# Patient Record
Sex: Male | Born: 1975
Health system: Southern US, Community
[De-identification: ages and names within clinical notes are randomized; demographics above are authoritative.]

## PROBLEM LIST (undated history)

## (undated) DIAGNOSIS — I10 Essential (primary) hypertension: Secondary | ICD-10-CM

## (undated) HISTORY — PX: MANDIBLE SURGERY: SHX707

## (undated) HISTORY — DX: Essential (primary) hypertension: I10

## (undated) HISTORY — PX: FRACTURE SURGERY: SHX138

---

## 2002-05-31 ENCOUNTER — Emergency Department (HOSPITAL_COMMUNITY): Admission: EM | Admit: 2002-05-31 | Discharge: 2002-05-31 | Payer: Self-pay | Admitting: Emergency Medicine

## 2009-08-31 ENCOUNTER — Encounter: Admission: RE | Admit: 2009-08-31 | Discharge: 2009-08-31 | Payer: Self-pay | Admitting: Internal Medicine

## 2009-08-31 ENCOUNTER — Ambulatory Visit (HOSPITAL_COMMUNITY): Admission: EM | Admit: 2009-08-31 | Discharge: 2009-08-31 | Payer: Self-pay | Admitting: Emergency Medicine

## 2011-01-26 LAB — CBC
HCT: 45.1 % (ref 39.0–52.0)
Hemoglobin: 15.7 g/dL (ref 13.0–17.0)
MCHC: 34.7 g/dL (ref 30.0–36.0)
RDW: 15 % (ref 11.5–15.5)

## 2011-01-26 LAB — DIFFERENTIAL
Basophils Absolute: 0 10*3/uL (ref 0.0–0.1)
Basophils Relative: 0 % (ref 0–1)
Eosinophils Relative: 0 % (ref 0–5)
Monocytes Absolute: 0.6 10*3/uL (ref 0.1–1.0)

## 2011-01-26 LAB — BASIC METABOLIC PANEL
BUN: 13 mg/dL (ref 6–23)
Chloride: 103 mEq/L (ref 96–112)
Creatinine, Ser: 1.1 mg/dL (ref 0.4–1.5)
GFR calc non Af Amer: >60 mL/min (ref >60)
Glucose, Bld: 94 mg/dL (ref 70–99)

## 2011-11-06 ENCOUNTER — Ambulatory Visit (INDEPENDENT_AMBULATORY_CARE_PROVIDER_SITE_OTHER): Payer: PRIVATE HEALTH INSURANCE

## 2011-11-06 DIAGNOSIS — L299 Pruritus, unspecified: Secondary | ICD-10-CM

## 2011-11-06 DIAGNOSIS — Z0000 Encounter for general adult medical examination without abnormal findings: Secondary | ICD-10-CM

## 2011-11-06 DIAGNOSIS — Z719 Counseling, unspecified: Secondary | ICD-10-CM

## 2011-11-06 DIAGNOSIS — F17200 Nicotine dependence, unspecified, uncomplicated: Secondary | ICD-10-CM

## 2012-04-18 ENCOUNTER — Telehealth: Payer: Self-pay

## 2012-04-18 NOTE — Telephone Encounter (Signed)
PT STATES THAT HE WAS PRESCRIBED BLOOD PRESSURE MEDICATION DURING HIS LAST OFFICE VISIT WITH [place] BUT HE NEVER HAD THE RX FILLED SO PT WOULD LIKE TO KNOW IF HE COULD HAVE THIS MEDICATION CALLED INTO THE PHARMACY. PHARMACY:[facility] ON [address]

## 2012-04-18 NOTE — Telephone Encounter (Signed)
Patient last seen in January... pls pull chart and route to PA pile to advise.

## 2012-04-19 MED ORDER — AMLODIPINE BESYLATE 5 MG PO TABS: 5.0000 mg | ORAL_TABLET | Freq: Every day | ORAL | Status: DC

## 2012-04-19 NOTE — Telephone Encounter (Signed)
Chart pulled to PA

## 2012-04-19 NOTE — Telephone Encounter (Signed)
Can start Norvasc 5 mg daily. Please have him RTC in 1 week to recheck the BP on the medication.

## 2012-04-22 NOTE — Telephone Encounter (Signed)
LMOM advising patient info below.

## 2012-05-21 ENCOUNTER — Ambulatory Visit (INDEPENDENT_AMBULATORY_CARE_PROVIDER_SITE_OTHER): Payer: BC Managed Care – PPO | Admitting: Internal Medicine

## 2012-05-21 ENCOUNTER — Encounter: Payer: Self-pay | Admitting: Internal Medicine

## 2012-05-21 VITALS — BP 122/92 | HR 76 | Temp 98.0°F | Resp 16 | Ht 65.0 in | Wt 230.0 lb

## 2012-05-21 DIAGNOSIS — F17200 Nicotine dependence, unspecified, uncomplicated: Secondary | ICD-10-CM

## 2012-05-21 DIAGNOSIS — I10 Essential (primary) hypertension: Secondary | ICD-10-CM | POA: Insufficient documentation

## 2012-05-21 DIAGNOSIS — Z79899 Other long term (current) drug therapy: Secondary | ICD-10-CM

## 2012-05-21 LAB — COMPREHENSIVE METABOLIC PANEL
Alkaline Phosphatase: 85 U/L (ref 39–117)
Creat: 1.1 mg/dL (ref 0.50–1.35)
Glucose, Bld: 90 mg/dL (ref 70–99)
Sodium: 140 mEq/L (ref 135–145)
Total Bilirubin: 0.6 mg/dL (ref 0.3–1.2)
Total Protein: 6.9 g/dL (ref 6.0–8.3)

## 2012-05-21 MED ORDER — AMLODIPINE BESYLATE 5 MG PO TABS: 5.0000 mg | ORAL_TABLET | Freq: Every day | ORAL | Status: DC

## 2012-05-21 NOTE — Progress Notes (Signed)
  Subjective:    Patient ID: Paul [name], male    DOB: 1976/08/31, 36 y.o.   MRN: [id number]  HPI HTN controlled Still smoking, agrees to quit Tolerates meds   Review of Systems     Objective:   Physical Exam Normal       Assessment & Plan:  RF meds 36yr

## 2012-05-24 ENCOUNTER — Encounter: Payer: Self-pay | Admitting: Radiology

## 2012-11-19 ENCOUNTER — Ambulatory Visit: Payer: BC Managed Care – PPO | Admitting: Internal Medicine

## 2013-02-15 ENCOUNTER — Ambulatory Visit (INDEPENDENT_AMBULATORY_CARE_PROVIDER_SITE_OTHER): Payer: PRIVATE HEALTH INSURANCE | Admitting: Internal Medicine

## 2013-02-15 ENCOUNTER — Ambulatory Visit: Payer: PRIVATE HEALTH INSURANCE

## 2013-02-15 VITALS — BP 130/92 | HR 77 | Temp 97.9°F | Resp 16 | Ht 65.0 in | Wt 240.0 lb

## 2013-02-15 DIAGNOSIS — Z0000 Encounter for general adult medical examination without abnormal findings: Secondary | ICD-10-CM

## 2013-02-15 DIAGNOSIS — I10 Essential (primary) hypertension: Secondary | ICD-10-CM

## 2013-02-15 DIAGNOSIS — E669 Obesity, unspecified: Secondary | ICD-10-CM

## 2013-02-15 DIAGNOSIS — Z79899 Other long term (current) drug therapy: Secondary | ICD-10-CM

## 2013-02-15 LAB — POCT CBC
Hemoglobin: 14.9 g/dL (ref 14.1–18.1)
Lymph, poc: 1.8 (ref 0.6–3.4)
MCH, POC: 28.5 pg (ref 27–31.2)
MCHC: 31.8 g/dL (ref 31.8–35.4)
MID (cbc): 0.6 (ref 0–0.9)
MPV: 9 fL (ref 0–99.8)
POC Granulocyte: 5.2 (ref 2–6.9)
POC LYMPH PERCENT: 23.1 %L (ref 10–50)
POC MID %: 8.2 %M (ref 0–12)
Platelet Count, POC: 285 10*3/uL (ref 142–424)
RDW, POC: 15 %

## 2013-02-15 LAB — POCT UA - MICROSCOPIC ONLY: Yeast, UA: NEGATIVE

## 2013-02-15 LAB — POCT URINALYSIS DIPSTICK
Bilirubin, UA: NEGATIVE
Ketones, UA: NEGATIVE
Leukocytes, UA: NEGATIVE
Nitrite, UA: NEGATIVE
Protein, UA: NEGATIVE
pH, UA: 6

## 2013-02-15 LAB — COMPREHENSIVE METABOLIC PANEL
ALT: 51 U/L (ref 0–53)
AST: 32 U/L (ref 0–37)
Alkaline Phosphatase: 95 U/L (ref 39–117)
Creat: 1.19 mg/dL (ref 0.50–1.35)
Sodium: 137 mEq/L (ref 135–145)
Total Bilirubin: 0.7 mg/dL (ref 0.3–1.2)
Total Protein: 7.3 g/dL (ref 6.0–8.3)

## 2013-02-15 LAB — LIPID PANEL
LDL Cholesterol: 109 mg/dL — ABNORMAL HIGH (ref 0–99)
Total CHOL/HDL Ratio: 4.6 Ratio
VLDL: 23 mg/dL (ref 0–40)

## 2013-02-15 LAB — POCT GLYCOSYLATED HEMOGLOBIN (HGB A1C): Hemoglobin A1C: 5.8

## 2013-02-15 MED ORDER — AMLODIPINE BESYLATE 5 MG PO TABS: 5.0000 mg | ORAL_TABLET | Freq: Every day | ORAL | Status: DC

## 2013-02-15 NOTE — Progress Notes (Signed)
Subjective:    Patient ID: Paul [name], male    DOB: [birth year], 37 y.o.   MRN: [id number]  HPI Doing well, working with nutritionist and losing weight. HTN controlled, feels good. Recently got married.   Review of Systems  Constitutional: Negative.   HENT: Negative.   Eyes: Negative.   Respiratory: Negative.   Cardiovascular: Negative.   Gastrointestinal: Negative.   Endocrine: Negative.   Genitourinary: Negative.   Musculoskeletal: Negative.   Skin: Negative.   Allergic/Immunologic: Negative.   Neurological: Negative.   Hematological: Negative.   Psychiatric/Behavioral: Negative.        Objective:   Physical Exam  Vitals reviewed. Constitutional: He is oriented to person, place, and time. He appears well-developed and well-nourished.  HENT:  Right Ear: External ear normal.  Left Ear: External ear normal.  Nose: Nose normal.  Mouth/Throat: Oropharynx is clear and moist.  Eyes: Conjunctivae and EOM are normal. Pupils are equal, [name], and reactive to light. No scleral icterus.  Neck: Normal range of motion. Neck supple. No tracheal deviation present. No thyromegaly present.  Cardiovascular: Normal rate, regular rhythm, normal heart sounds and intact distal pulses.   Pulmonary/Chest: Effort normal and breath sounds normal.  Abdominal: Bowel sounds are normal.  Genitourinary: Penis normal.  Musculoskeletal: Normal range of motion. He exhibits no edema and no tenderness.  Lymphadenopathy:    He has no cervical adenopathy.  Neurological: He is alert and oriented to person, place, and time. No cranial nerve deficit. He exhibits normal muscle tone. Coordination normal.  Skin: Skin is warm and dry.  Psychiatric: He has a normal mood and affect. His behavior is normal. Judgment and thought content normal.    Results for orders placed in visit on 02/15/13  POCT CBC      Result Value Range   WBC 7.6  4.6 - 10.2 K/uL   Lymph, poc 1.8  0.6 - 3.4   POC LYMPH PERCENT  23.1  10 - 50 %L   MID (cbc) 0.6  0 - 0.9   POC MID % 8.2  0 - 12 %M   POC Granulocyte 5.2  2 - 6.9   Granulocyte percent 68.7  37 - 80 %G   RBC 5.22  4.69 - 6.13 M/uL   Hemoglobin 14.9  14.1 - 18.1 g/dL   HCT, POC 40.9  81.1 - 53.7 %   MCV 89.7  80 - 97 fL   MCH, POC 28.5  27 - 31.2 pg   MCHC 31.8  31.8 - 35.4 g/dL   RDW, POC 91.4     Platelet Count, POC 285  142 - 424 K/uL   MPV 9.0  0 - 99.8 fL  POCT UA - MICROSCOPIC ONLY      Result Value Range   WBC, Ur, HPF, POC 0-1     RBC, urine, microscopic 2-5     Bacteria, U Microscopic trace     Mucus, UA neg     Epithelial cells, urine per micros 0-1     Crystals, Ur, HPF, POC neg     Casts, Ur, LPF, POC neg     Yeast, UA neg    POCT URINALYSIS DIPSTICK      Result Value Range   Color, UA yellow     Clarity, UA clear     Glucose, UA neg     Bilirubin, UA neg     Ketones, UA neg     Spec Grav, UA 1.025  Blood, UA small     pH, UA 6.0     Protein, UA neg     Urobilinogen, UA 0.2     Nitrite, UA neg     Leukocytes, UA Negative    GLUCOSE, POCT (MANUAL RESULT ENTRY)      Result Value Range   POC Glucose 110 (*) 70 - 99 mg/dl  POCT GLYCOSYLATED HEMOGLOBIN (HGB A1C)      Result Value Range   Hemoglobin A1C 5.8     [facility] reading (PRIMARY) by  Dr.Guest smoker, normal cxr        Assessment & Plan:  HTN controlled Obesity improving RF meds 1 yr

## 2013-02-15 NOTE — Patient Instructions (Signed)
Hypertension As your heart beats, it forces blood through your arteries. This force is your blood pressure. If the pressure is too high, it is called hypertension (HTN) or high blood pressure. HTN is dangerous because you may have it and not know it. High blood pressure may mean that your heart has to work harder to pump blood. Your arteries may be narrow or stiff. The extra work puts you at risk for heart disease, stroke, and other problems.  Blood pressure consists of two numbers, a higher number over a lower, 110/72, for example. It is stated as "110 over 72." The ideal is below 120 for the top number (systolic) and under 80 for the bottom (diastolic). Write down your blood pressure today. You should pay close attention to your blood pressure if you have certain conditions such as:  Heart failure.  Prior heart attack.  Diabetes  Chronic kidney disease.  Prior stroke.  Multiple risk factors for heart disease. To see if you have HTN, your blood pressure should be measured while you are seated with your arm held at the level of the heart. It should be measured at least twice. A one-time elevated blood pressure reading (especially in the Emergency Department) does not mean that you need treatment. There may be conditions in which the blood pressure is different between your right and left arms. It is important to see your caregiver soon for a recheck. Most people have essential hypertension which means that there is not a specific cause. This type of high blood pressure may be lowered by changing lifestyle factors such as:  Stress.  Smoking.  Lack of exercise.  Excessive weight.  Drug/tobacco/alcohol use.  Eating less salt. Most people do not have symptoms from high blood pressure until it has caused damage to the body. Effective treatment can often prevent, delay or reduce that damage. TREATMENT  When a cause has been identified, treatment for high blood pressure is directed at the  cause. There are a large number of medications to treat HTN. These fall into several categories, and your caregiver will help you select the medicines that are best for you. Medications may have side effects. You should review side effects with your caregiver. If your blood pressure stays high after you have made lifestyle changes or started on medicines,   Your medication(s) may need to be changed.  Other problems may need to be addressed.  Be certain you understand your prescriptions, and know how and when to take your medicine.  Be sure to follow up with your caregiver within the time frame advised (usually within two weeks) to have your blood pressure rechecked and to review your medications.  If you are taking more than one medicine to lower your blood pressure, make sure you know how and at what times they should be taken. Taking two medicines at the same time can result in blood pressure that is too low. SEEK IMMEDIATE MEDICAL CARE IF:  You develop a severe headache, blurred or changing vision, or confusion.  You have unusual weakness or numbness, or a faint feeling.  You have severe chest or abdominal pain, vomiting, or breathing problems. MAKE SURE YOU:   Understand these instructions.  Will watch your condition.  Will get help right away if you are not doing well or get worse. Document Released: 10/10/2005 Document Revised: 01/02/2012 Document Reviewed: 05/30/2008 [facility] Patient Information 2013 [place], [place]. Nicotine Addiction Nicotine can act as both a stimulant (excites/activates) and a sedative (calms/quiets). Immediately after  exposure to nicotine, there is a "kick" caused in part by the drug's stimulation of the adrenal glands and resulting discharge of adrenaline (epinephrine). The rush of adrenaline stimulates the body and causes a sudden release of sugar. This means that smokers are always slightly hyperglycemic. Hyperglycemic means that the blood sugar is high,  just like in diabetics. Nicotine also decreases the amount of insulin which helps control sugar levels in the body. There is an increase in blood pressure, breathing, and the rate of heart beats.  In addition, nicotine indirectly causes a release of dopamine in the brain that controls pleasure and motivation. A similar reaction is seen with other drugs of abuse, such as cocaine and heroin. This dopamine release is thought to cause the pleasurable sensations when smoking. In some different cases, nicotine can also create a calming effect, depending on sensitivity of the smoker's nervous system and the dose of nicotine taken. WHAT HAPPENS WHEN NICOTINE IS TAKEN FOR LONG PERIODS OF TIME?  Long-term use of nicotine results in addiction. It is difficult to stop.  Repeated use of nicotine creates tolerance. Higher doses of nicotine are needed to get the "kick." When nicotine use is stopped, withdrawal may last a month or more. Withdrawal may begin within a few hours after the last cigarette. Symptoms peak within the first few days and may lessen within a few weeks. For some people, however, symptoms may last for months or longer. Withdrawal symptoms include:   Irritability.  Craving.  Learning and attention deficits.  Sleep disturbances.  Increased appetite. Craving for tobacco may last for 6 months or longer. Many behaviors done while using nicotine can also play a part in the severity of withdrawal symptoms. For some people, the feel, smell, and sight of a cigarette and the ritual of obtaining, handling, lighting, and smoking the cigarette are closely linked with the pleasure of smoking. When stopped, they also miss the related behaviors which make the withdrawal or craving worse. While nicotine gum and patches may lessen the drug aspects of withdrawal, cravings often persist. WHAT ARE THE MEDICAL CONSEQUENCES OF NICOTINE USE?  Nicotine addiction accounts for one-third of all cancers. The top cancer  caused by tobacco is lung cancer. Lung cancer is the number one cancer killer of both men and women.  Smoking is also associated with cancers of the:  Mouth.  Pharynx.  Larynx.  Esophagus.  Stomach.  Pancreas.  Cervix.  Kidney.  Ureter.  Bladder.  Smoking also causes lung diseases such as lasting (chronic) bronchitis and emphysema.  It worsens asthma in adults and children.  Smoking increases the risk of heart disease, including:  Stroke.  Heart attack.  Vascular disease.  Aneurysm.  Passive or secondary smoke can also increase medical risks including:  Asthma in children.  Sudden Infant Death Syndrome (SIDS).  Additionally, dropped cigarettes are the leading cause of residential fire fatalities.  Nicotine poisoning has been reported from accidental ingestion of tobacco products by children and pets. Death usually results in a few minutes from respiratory failure (when a person stops breathing) caused by paralysis. TREATMENT   Medication. Nicotine replacement medicines such as nicotine gum and the patch are used to stop smoking. These medicines gradually lower the dosage of nicotine in the body. These medicines do not contain the carbon monoxide and other toxins found in tobacco smoke.  Hypnotherapy.  Relaxation therapy.  Nicotine Anonymous (a 12-step support program). Find times and locations in your local yellow pages. Document Released: 06/15/2004 Document Revised:  01/02/2012 Document Reviewed: 11/07/2007 [employer] Patient Information 2013 [place], [place].

## 2013-02-26 LAB — IFOBT (OCCULT BLOOD): IFOBT: NEGATIVE

## 2013-02-27 ENCOUNTER — Encounter: Payer: Self-pay | Admitting: Radiology

## 2013-12-27 ENCOUNTER — Ambulatory Visit (INDEPENDENT_AMBULATORY_CARE_PROVIDER_SITE_OTHER): Payer: 59 | Admitting: Family Medicine

## 2013-12-27 VITALS — BP 128/82 | HR 81 | Temp 98.5°F | Resp 16 | Ht 65.0 in | Wt 232.0 lb

## 2013-12-27 DIAGNOSIS — Z79899 Other long term (current) drug therapy: Secondary | ICD-10-CM

## 2013-12-27 DIAGNOSIS — Z0000 Encounter for general adult medical examination without abnormal findings: Secondary | ICD-10-CM

## 2013-12-27 DIAGNOSIS — E669 Obesity, unspecified: Secondary | ICD-10-CM

## 2013-12-27 DIAGNOSIS — I10 Essential (primary) hypertension: Secondary | ICD-10-CM

## 2013-12-27 LAB — COMPREHENSIVE METABOLIC PANEL
ALT: 54 U/L — ABNORMAL HIGH (ref 0–53)
AST: 35 U/L (ref 0–37)
Albumin: 4.4 g/dL (ref 3.5–5.2)
Alkaline Phosphatase: 84 U/L (ref 39–117)
BILIRUBIN TOTAL: 0.6 mg/dL (ref 0.2–1.2)
BUN: 18 mg/dL (ref 6–23)
CO2: 25 meq/L (ref 19–32)
CREATININE: 1.18 mg/dL (ref 0.50–1.35)
Calcium: 9.2 mg/dL (ref 8.4–10.5)
Chloride: 103 mEq/L (ref 96–112)
GLUCOSE: 85 mg/dL (ref 70–99)
Potassium: 4 mEq/L (ref 3.5–5.3)
Sodium: 136 mEq/L (ref 135–145)
Total Protein: 7.1 g/dL (ref 6.0–8.3)

## 2013-12-27 LAB — POCT CBC
Granulocyte percent: 66.3 %G (ref 37–80)
HEMATOCRIT: 44.7 % (ref 43.5–53.7)
Hemoglobin: 14.2 g/dL (ref 14.1–18.1)
LYMPH, POC: 2.6 (ref 0.6–3.4)
MCH: 28.8 pg (ref 27–31.2)
MCHC: 31.8 g/dL (ref 31.8–35.4)
MCV: 90.7 fL (ref 80–97)
MID (cbc): 0.6 (ref 0–0.9)
MPV: 9 fL (ref 0–99.8)
POC GRANULOCYTE: 6.2 (ref 2–6.9)
POC LYMPH %: 27.4 % (ref 10–50)
POC MID %: 6.3 %M (ref 0–12)
Platelet Count, POC: 290 10*3/uL (ref 142–424)
RBC: 4.93 M/uL (ref 4.69–6.13)
RDW, POC: 15.3 %
WBC: 9.4 10*3/uL (ref 4.6–10.2)

## 2013-12-27 LAB — LIPID PANEL
CHOL/HDL RATIO: 5 ratio
CHOLESTEROL: 166 mg/dL (ref 0–200)
HDL: 33 mg/dL — ABNORMAL LOW (ref >39)
LDL Cholesterol: 114 mg/dL — ABNORMAL HIGH (ref 0–99)
TRIGLYCERIDES: 93 mg/dL (ref <150)
VLDL: 19 mg/dL (ref 0–40)

## 2013-12-27 LAB — POCT GLYCOSYLATED HEMOGLOBIN (HGB A1C): HEMOGLOBIN A1C: 5.8

## 2013-12-27 MED ORDER — AMLODIPINE BESYLATE 5 MG PO TABS: 5.0000 mg | ORAL_TABLET | Freq: Every day | ORAL | Status: DC

## 2013-12-27 NOTE — Patient Instructions (Signed)
Continue same medication  I will send you a copy of the labs if they are normal, otherwise will call you.  Return in one year or as needed

## 2013-12-27 NOTE — Progress Notes (Signed)
Physical examination:  History: Patient is here for his regular physical examination. No major acute complaints. He needs a refill on his blood pressure medications.  Past medical history: Medications: Antihypertensive Allergies: None Past medical history: Hypertension Past surgical history: Fracture drawl, right thumb surgery  Family history: Mother and father both are deceased from lung cancer. Sister is living and well.  Social history: Tobacco use: Approximately one to 2 cigarettes a day, with one pack lasting him at least a week. Alcohol: Episodic Drugs: None Married, works [employer].  Review of systems: Constitutional: Unremarkable HEENT: Wears glasses. No other complaints. Needs new lenses Respiratory: Unremarkable Cardiovascular: Unremarkable Gastrointestinal: Unremarkable Genitourinary: Unremarkable Endocrine: Unremarkable Musculoskeletal: Multiple joint pains Dermatologic: Unremarkable Allergies: Unremarkable Neurologic: Unremarkable Hematologic: Unremarkable Psychiatric: Unremarkable   Physical examination. Overweight male in no major distress. TMs normal. Eyes PERRLA. Fundi benign. Throat clear. Neck supple without nodes or thyromegaly. Has a skin tag on his right shoulder. Chest is clear to auscultation. Heart regular without murmurs gallops or arrhythmias. Abdomen soft without organomegaly mass or tenderness. Normal male external genitalia testes descended. No hernias. Extremities unremarkable. Skin unremarkable.  Assessment: Normal physical examination Hypertension Obesity  Plan: Labs were ordered. Will need to complete his form.    Results for orders placed in visit on 12/27/13  POCT CBC      Result Value Ref Range   WBC 9.4  4.6 - 10.2 K/uL   Lymph, poc 2.6  0.6 - 3.4   POC LYMPH PERCENT 27.4  10 - 50 %L   MID (cbc) 0.6  0 - 0.9   POC MID % 6.3  0 - 12 %M   POC Granulocyte 6.2  2 - 6.9   Granulocyte percent 66.3  37 - 80 %G   RBC 4.93  4.69 - 6.13 M/uL   Hemoglobin 14.2  14.1 - 18.1 g/dL   HCT, POC 16.144.7  09.643.5 - 53.7 %   MCV 90.7  80 - 97 fL   MCH, POC 28.8  27 - 31.2 pg   MCHC 31.8  31.8 - 35.4 g/dL   RDW, POC 04.515.3     Platelet Count, POC 290  142 - 424 K/uL   MPV 9.0  0 - 99.8 fL  POCT GLYCOSYLATED HEMOGLOBIN (HGB A1C)      Result Value Ref Range   Hemoglobin A1C 5.8

## 2013-12-28 ENCOUNTER — Encounter: Payer: Self-pay | Admitting: *Deleted

## 2014-01-10 ENCOUNTER — Other Ambulatory Visit: Payer: Self-pay | Admitting: Internal Medicine

## 2014-04-23 ENCOUNTER — Other Ambulatory Visit: Payer: Self-pay | Admitting: Physician Assistant

## 2014-08-09 ENCOUNTER — Other Ambulatory Visit: Payer: Self-pay | Admitting: Family Medicine

## 2014-12-30 ENCOUNTER — Encounter: Payer: 59 | Admitting: Family Medicine

## 2015-01-05 ENCOUNTER — Ambulatory Visit (INDEPENDENT_AMBULATORY_CARE_PROVIDER_SITE_OTHER): Payer: 59 | Admitting: Family Medicine

## 2015-01-05 VITALS — BP 128/88 | HR 76 | Temp 98.7°F | Resp 16 | Ht 66.0 in | Wt 235.4 lb

## 2015-01-05 DIAGNOSIS — M7711 Lateral epicondylitis, right elbow: Secondary | ICD-10-CM

## 2015-01-05 DIAGNOSIS — F17200 Nicotine dependence, unspecified, uncomplicated: Secondary | ICD-10-CM

## 2015-01-05 DIAGNOSIS — Z720 Tobacco use: Secondary | ICD-10-CM

## 2015-01-05 DIAGNOSIS — Z0000 Encounter for general adult medical examination without abnormal findings: Secondary | ICD-10-CM

## 2015-01-05 DIAGNOSIS — E669 Obesity, unspecified: Secondary | ICD-10-CM

## 2015-01-05 LAB — POCT GLYCOSYLATED HEMOGLOBIN (HGB A1C): Hemoglobin A1C: 5.7

## 2015-01-05 NOTE — Patient Instructions (Signed)
Whenever your elbow flares up start icing it for about 15 minutes several times a day. You can also take over-the-counter Aleve 2 pills twice daily for a couple of weeks. If it does not come on down at those times you can return and we will try some other things for it. Also suggest trying to use an elbow band that you can buy at the pharmacy.  Work hard on losing weight. You need to try and get regular exercise such as a daily 30 minute for 45 minute walk or going to a gym. Also decrease her portion sizes meals, cut out liquid calories and most snacks, and try and fill up on more fruits and vegetables.  Pick a date such as Easter and quit the cigarettes. If you cannot do so return to get a prescription of a medicine such as Wellbutrin (bupropion)  We will fax in the results of your labs and they become available

## 2015-01-05 NOTE — Progress Notes (Signed)
Expand All Collapse All   Physical examination:  History: Patient is here for his regular physical examination. No major acute complaints. He needs a refill on his blood pressure medications.  Past medical history: Medications: Antihypertensive Allergies: None Past medical history: Hypertension Past surgical history: Fracture jaw, right thumb surgery  Family history: Mother and father both are deceased from lung cancer. Sister is living and well.  Social history: Tobacco use: Approximately one to 2 cigarettes a day, with one pack lasting him at least a week. 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  Review of systems: Constitutional: Unremarkable HEENT: Wears glasses. No other complaints. Needs new lenses Respiratory: Unremarkable Cardiovascular: Unremarkable Gastrointestinal: Unremarkable Genitourinary: Unremarkable Endocrine: Unremarkable Musculoskeletal: Multiple joint pains. He has right elbow pains off and on, especially related to doing a lot of lifting and such at work Dermatologic: Unremarkable Allergies: Unremarkable Neurologic: Unremarkable Hematologic: Unremarkable Psychiatric: Unremarkable   Physical examination. Overweight male in no major distress. TMs normal. Eyes PERRLA. Fundi benign. Throat clear. Neck supple without nodes or thyromegaly. Has a skin tag on his right shoulder. Chest is clear to auscultation. Heart regular without murmurs gallops or arrhythmias. Abdomen soft without organomegaly mass or tenderness. Normal male external genitalia testes descended. No hernias. Extremities unremarkable. Skin unremarkable  Assessment: Annual physical examination Overweight Tobacco use disorder, psychological dependence Intermittent right elbow pains, not inflamed currently  Plan: Patient needs lab testing done per his company physical requirements. Last year the HDL was low, and he is encouraged to try and get more  regular exercise. It is very important for his long-term health for him to lose weight and get off those last couple of cigarettes he smokes. We talked about medication options, and he is just going to try and commit to a date like Easter and quitting cigarettes and never touch another one.  If he fails in quitting his cigarettes we will try him with a course of Wellbutrin  Recommended a tennis elbow band to wear on his elbow when it starts flaring.

## 2015-01-06 LAB — COMPREHENSIVE METABOLIC PANEL
ALBUMIN: 4.3 g/dL (ref 3.5–5.2)
ALT: 41 U/L (ref 0–53)
AST: 45 U/L — ABNORMAL HIGH (ref 0–37)
Alkaline Phosphatase: 105 U/L (ref 39–117)
BUN: 12 mg/dL (ref 6–23)
CALCIUM: 9.5 mg/dL (ref 8.4–10.5)
CHLORIDE: 102 meq/L (ref 96–112)
CO2: 23 meq/L (ref 19–32)
Creat: 1.19 mg/dL (ref 0.50–1.35)
GLUCOSE: 90 mg/dL (ref 70–99)
POTASSIUM: 3.8 meq/L (ref 3.5–5.3)
SODIUM: 137 meq/L (ref 135–145)
TOTAL PROTEIN: 7.7 g/dL (ref 6.0–8.3)
Total Bilirubin: 1.9 mg/dL — ABNORMAL HIGH (ref 0.2–1.2)

## 2015-01-06 LAB — LIPID PANEL
CHOLESTEROL: 205 mg/dL — AB (ref 0–200)
HDL: 46 mg/dL (ref >=40)
LDL CALC: 124 mg/dL — AB (ref 0–99)
Total CHOL/HDL Ratio: 4.5 Ratio
Triglycerides: 175 mg/dL — ABNORMAL HIGH (ref <150)
VLDL: 35 mg/dL (ref 0–40)

## 2015-01-09 ENCOUNTER — Encounter: Payer: Self-pay | Admitting: Family Medicine

## 2015-02-04 IMAGING — CR DG CHEST 2V
2 series · 2 of 2 positions shown · non-contrast
Comparison: None

CLINICAL DATA: Complete physical exam.  Hypertension.

CHEST - 2 VIEW

[PA]
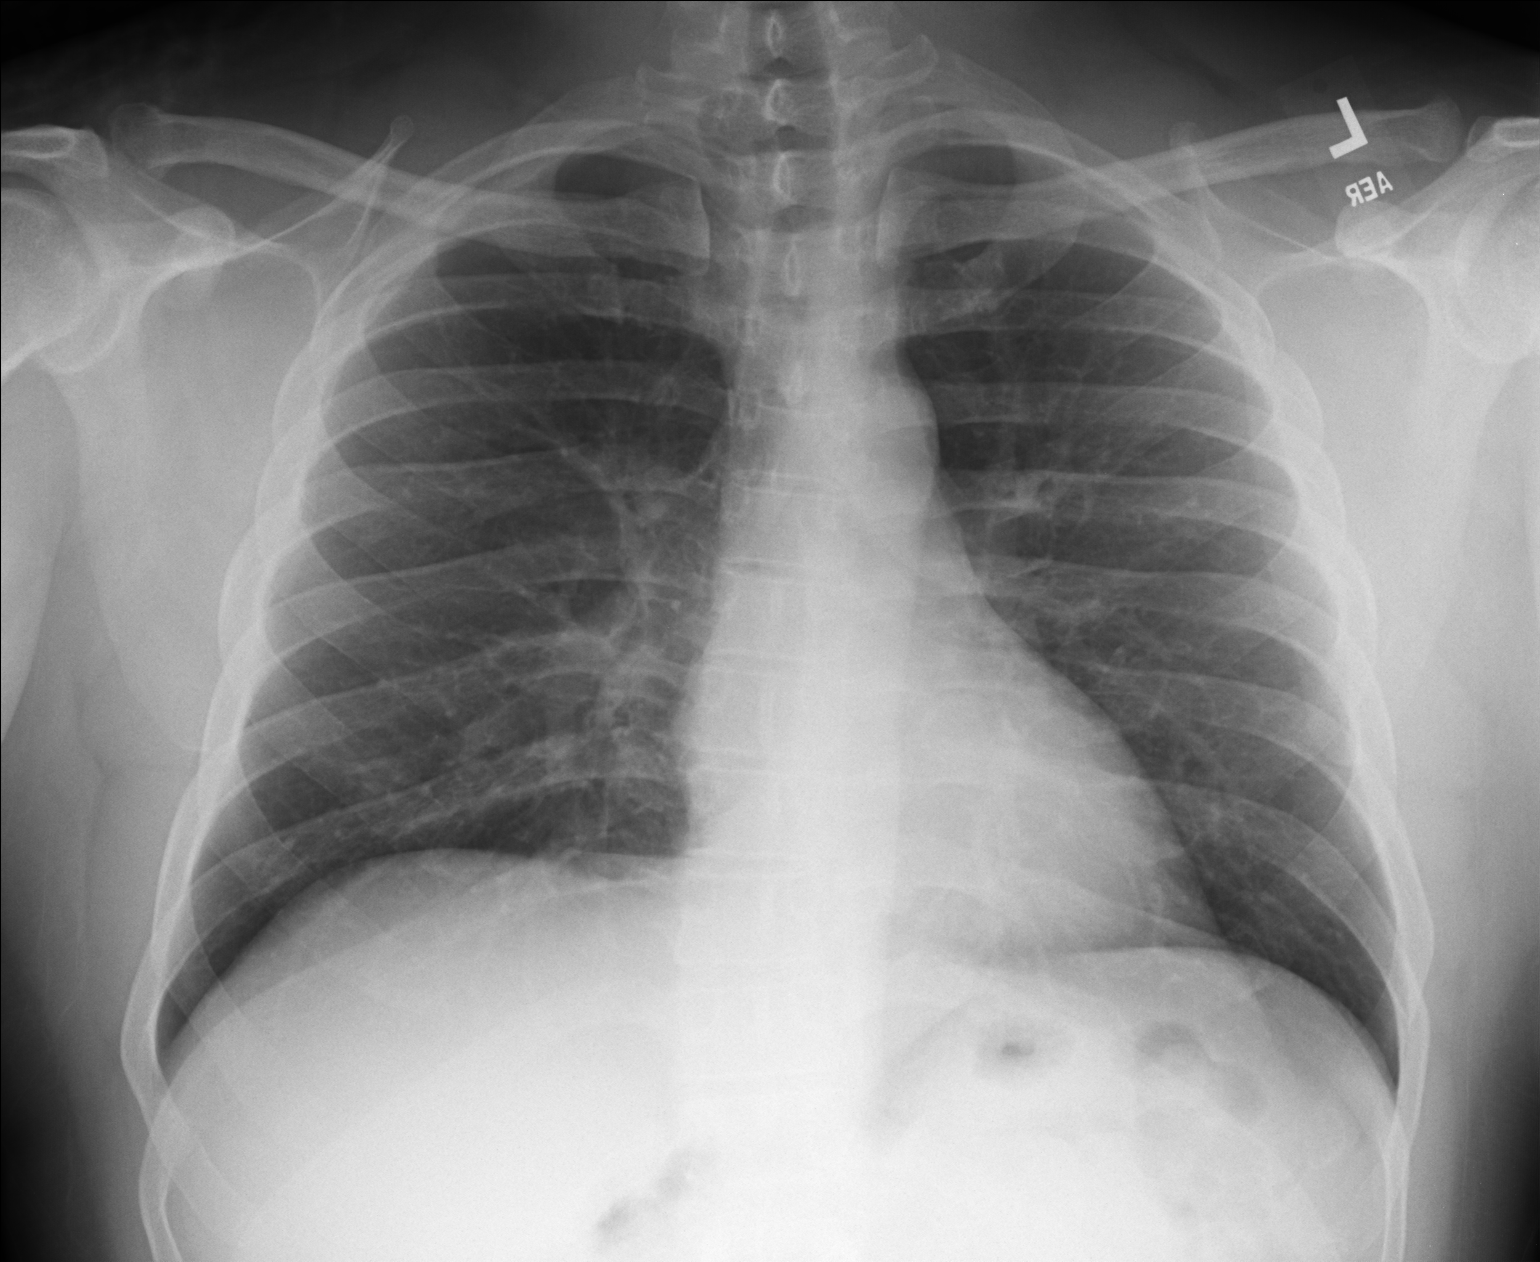

[lateral]
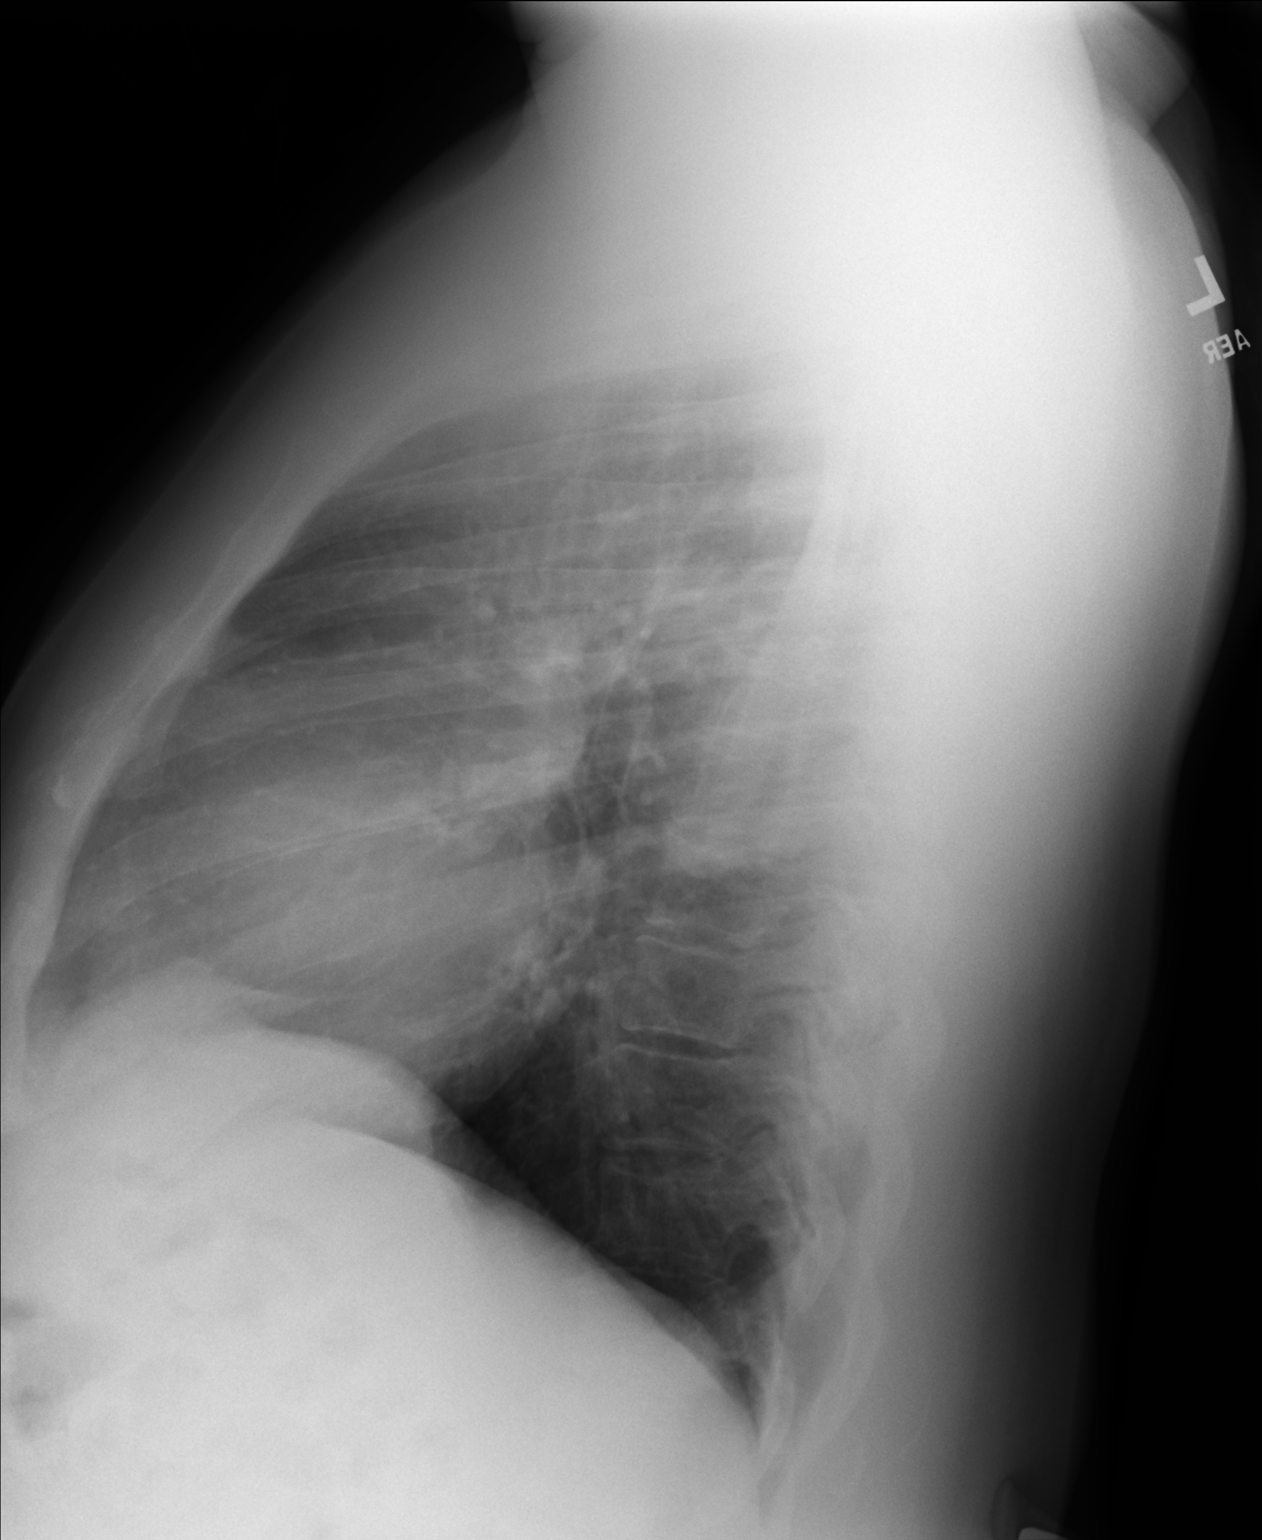

[2 of 2 positions shown; findings below may reference images not displayed]

FINDINGS: Heart and mediastinal contours are within normal limits.
No focal opacities or effusions.  No acute bony abnormality.
IMPRESSION: No active cardiopulmonary disease.

## 2015-03-31 ENCOUNTER — Other Ambulatory Visit: Payer: Self-pay | Admitting: Family Medicine

## 2015-07-30 ENCOUNTER — Other Ambulatory Visit: Payer: Self-pay | Admitting: Family Medicine

## 2015-09-11 ENCOUNTER — Ambulatory Visit: Payer: 59 | Admitting: Family Medicine

## 2015-09-14 ENCOUNTER — Ambulatory Visit (INDEPENDENT_AMBULATORY_CARE_PROVIDER_SITE_OTHER): Payer: 59 | Admitting: Family Medicine

## 2015-09-14 ENCOUNTER — Ambulatory Visit (INDEPENDENT_AMBULATORY_CARE_PROVIDER_SITE_OTHER): Payer: 59

## 2015-09-14 VITALS — BP 130/86 | HR 74 | Temp 98.7°F | Resp 18 | Ht 68.0 in | Wt 235.8 lb

## 2015-09-14 DIAGNOSIS — M545 Low back pain, unspecified: Secondary | ICD-10-CM

## 2015-09-14 DIAGNOSIS — R319 Hematuria, unspecified: Secondary | ICD-10-CM

## 2015-09-14 LAB — POCT URINALYSIS DIP (MANUAL ENTRY)
Bilirubin, UA: NEGATIVE
Glucose, UA: NEGATIVE
Ketones, POC UA: NEGATIVE
Leukocytes, UA: NEGATIVE
Nitrite, UA: NEGATIVE
Spec Grav, UA: 1.025
Urobilinogen, UA: 1
pH, UA: 6

## 2015-09-14 MED ORDER — PREDNISONE 20 MG PO TABS: ORAL_TABLET | ORAL | Status: DC

## 2015-09-14 MED ORDER — CYCLOBENZAPRINE HCL 5 MG PO TABS: 5.0000 mg | ORAL_TABLET | Freq: Every day | ORAL | Status: DC

## 2015-09-14 NOTE — Patient Instructions (Signed)
Please return next Tuesday, 09/22/2015

## 2015-09-14 NOTE — Progress Notes (Signed)
This chart was scribed for Elvina Sidle, MD by Stann Ore, medical scribe at [facility].The patient was seen in exam room 9 and the patient's care was started at 3:33 PM.  Patient ID: Paul [name] MRN: [id number], DOB: 08/02/1976, 39 y.o. Date of Encounter: 09/14/2015  Primary Physician: No primary care provider on file.  Chief Complaint:  Chief Complaint  Patient presents with  . Back Pain    lower back pain x 1week    HPI:  Paul [name] is a 39 y.o. male who presents to [facility] complaining of lower back pain for almost a month now. He had back pain in past and thought it was just due to sleeping position. He denies injuries in the area. He denies urinary symptoms, fever. He denies family history of back problems. He's taken ibuprofen for temporary mild relief. He goes to sleep early around 8:00-9:00 PM. He states it hurts laying down straight so he has to bend his legs up.   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   Past Medical History  Diagnosis Date  . Hypertension      Home Meds: Prior to Admission medications   Medication Sig Start Date End Date Taking? Authorizing Provider  amLODipine (NORVASC) 5 MG tablet TAKE 1 TABLET BY MOUTH DAILY 07/31/15  Yes Peyton Najjar, MD    Allergies: No Known Allergies  Social History   Social History  . Marital Status: Married    Spouse Name: N/A  . Number of Children: N/A  . Years of Education: N/A   Occupational History  . Not on file.   Social History Main Topics  . Smoking status: Current Every Day Smoker    Types: Cigarettes  . Smokeless tobacco: Not on file  . Alcohol Use: Yes     Comment: socially  . Drug Use: No  . Sexual Activity: Yes   Other Topics Concern  . Not on file   Social History Narrative     Review of Systems: Constitutional: negative for fever, chills, night sweats, weight changes, or fatigue  HEENT: negative for vision changes,  hearing loss, congestion, rhinorrhea, ST, epistaxis, or sinus pressure Cardiovascular: negative for chest pain or palpitations Respiratory: negative for hemoptysis, wheezing, shortness of breath, or cough Abdominal: negative for abdominal pain, nausea, vomiting, diarrhea, or constipation Dermatological: negative for rash Neurologic: negative for headache, dizziness, or syncope Musc: positive for lower back pain All other systems reviewed and are otherwise negative with the exception to those above and in the HPI.  Physical Exam: Blood pressure 130/86, pulse 74, temperature 98.7 F (37.1 C), temperature source Oral, resp. rate 18, height  (1.727 m), weight 235 lb 12.8 oz (106.958 kg), SpO2 97 %., Body mass index is 35.86 kg/(m^2). General: Well developed, well nourished, in no acute distress. Head: Normocephalic, atraumatic, eyes without discharge, sclera non-icteric, nares are without discharge. Bilateral auditory canals clear, TM's are without perforation, pearly grey and translucent with reflective cone of light bilaterally. Oral cavity moist, posterior pharynx without exudate, erythema, peritonsillar abscess, or post nasal drip.  Neck: Supple. No thyromegaly. Full ROM. No lymphadenopathy. Lungs: Clear bilaterally to auscultation without wheezes, rales, or rhonchi. Breathing is unlabored. Heart: RRR with S1 S2. No murmurs, rubs, or gallops appreciated. Abdomen: Soft, non-tender, non-distended with normoactive bowel sounds. No hepatomegaly. No rebound/guarding. No obvious abdominal masses. Msk:  Strength and tone normal for age; tender over central and right lower lumbar  spine Extremities/Skin: Warm and dry. Barely positive seated straight leg raise bilaterally Neuro: Alert and oriented X 3. Moves all extremities spontaneously. Gait is normal. CNII-XII grossly in tact. Psych:  Responds to questions appropriately with a normal affect.   [facility] reading (PRIMARY) by Dr. Milus GlazierLauenstein : xray  lumbar spine: small amount of wedging in the T-12  Labs:  Results for orders placed or performed in visit on 09/14/15  POCT urinalysis dipstick  Result Value Ref Range   Color, UA yellow yellow   Clarity, UA clear clear   Glucose, UA negative negative   Bilirubin, UA negative negative   Ketones, POC UA negative negative   Spec Grav, UA 1.025    Blood, UA moderate (A) negative   pH, UA 6.0    Protein Ur, POC trace (A) negative   Urobilinogen, UA 1.0    Nitrite, UA Negative Negative   Leukocytes, UA Negative Negative     ASSESSMENT AND PLAN:  39 y.o. year old male with  This chart was scribed in my presence and reviewed by me personally.    ICD-9-CM ICD-10-CM   1. Right-sided low back pain without sciatica 724.2 M54.5 POCT urinalysis dipstick     DG Lumbar Spine 2-3 Views     predniSONE (DELTASONE) 20 MG tablet     cyclobenzaprine (FLEXERIL) 5 MG tablet  2. Hematuria 599.70 R31.9     I'm concerned about the blood in the urine so I have asked the patient to return in one week  By signing my name below, I, Stann Oresung-Kai Tsai, attest that this documentation has been prepared under the direction and in the presence of Elvina SidleKurt Jakyrah Holladay, MD. Electronically Signed: Stann Oresung-Kai Tsai, Scribe. 09/14/2015 , 3:33 PM .  Signed, Elvina SidleKurt Jareli Highland, MD 09/14/2015 3:33 PM

## 2015-09-22 ENCOUNTER — Ambulatory Visit (INDEPENDENT_AMBULATORY_CARE_PROVIDER_SITE_OTHER): Payer: 59 | Admitting: Family Medicine

## 2015-09-22 VITALS — BP 132/88 | HR 77 | Temp 98.6°F | Resp 18 | Wt 237.0 lb

## 2015-09-22 DIAGNOSIS — S39012D Strain of muscle, fascia and tendon of lower back, subsequent encounter: Secondary | ICD-10-CM | POA: Diagnosis not present

## 2015-09-22 DIAGNOSIS — M545 Low back pain, unspecified: Secondary | ICD-10-CM

## 2015-09-22 MED ORDER — CYCLOBENZAPRINE HCL 5 MG PO TABS: 5.0000 mg | ORAL_TABLET | Freq: Every day | ORAL | Status: DC

## 2015-09-22 MED ORDER — DICLOFENAC SODIUM 75 MG PO TBEC: 75.0000 mg | DELAYED_RELEASE_TABLET | Freq: Two times a day (BID) | ORAL | Status: DC

## 2015-09-22 NOTE — Progress Notes (Signed)
Patient ID: Paul [name], male    DOB: 1976/08/28  Age: 39 y.o. MRN: [id number]  Chief Complaint  Patient presents with  . Follow-up    back pain    Subjective:   39 year old gentleman who has been having back pain recently. He was seen here a couple of weeks ago treated with cyclobenzaprine and prednisone. He did some better, but he was off for the week. Now that is gone back to work it is hurting him more again. It hurts in the midline low back. He has to lift and straining a lot with his job unfortunately. No specific injury known.  Current allergies, medications, problem list, past/family and social histories reviewed.  Objective:  BP 132/88 mmHg  Pulse 77  Temp(Src) 98.6 F (37 C) (Oral)  Resp 18  Wt 237 lb (107.502 kg)  SpO2 97%  Moderate pain. It hurts a lot when he flexes anteriorly. His tender down the midline about and will 226. He has less pain on extension than on flexion. No radiculopathy.  Assessment & Plan:   Assessment: 1. Right-sided low back pain without sciatica   2. Back strain, subsequent encounter       Plan: Continue to treat with nonsteroidal anti-inflammatory medication and muscle relaxation. If he is not improving we should send for physical therapy probably.  Meds ordered this encounter  Medications  . diclofenac (VOLTAREN) 75 MG EC tablet    Sig: Take 1 tablet (75 mg total) by mouth 2 (two) times daily.    Dispense:  30 tablet    Refill:  1  . cyclobenzaprine (FLEXERIL) 5 MG tablet    Sig: Take 1 tablet (5 mg total) by mouth at bedtime.    Dispense:  20 tablet    Refill:  1         Patient Instructions  Take the diclofenac anti-inflammatory pain reliever one twice daily at breakfast and supper  In addition to that you can take extra strength Tylenol 500 mg 2 pills 3 times daily if needed for worse pain  Take the cyclobenzaprine 5 mg muscle relaxant one at bedtime  If not doing better in the next 2 weeks or so we may need to  refer you to physical therapy  Try to do some of the below mentioned exercises and stretches 2-3 times daily.  Always squat and lift from your knees, not lifting by bending over and lifting.  Back Exercises The following exercises strengthen the muscles that help to support the back. They also help to keep the lower back flexible. Doing these exercises can help to prevent back pain or lessen existing pain. If you have back pain or discomfort, try doing these exercises 2-3 times each day or as told by your health care provider. When the pain goes away, do them once each day, but increase the number of times that you repeat the steps for each exercise (do more repetitions). If you do not have back pain or discomfort, do these exercises once each day or as told by your health care provider. EXERCISES Single Knee to Chest Repeat these steps 3-5 times for each leg: 1. Lie on your back on a firm bed or the floor with your legs extended. 2. Bring one knee to your chest. Your other leg should stay extended and in contact with the floor. 3. Hold your knee in place by grabbing your knee or thigh. 4. Pull on your knee until you feel a gentle stretch in your lower  back. 5. Hold the stretch for 10-30 seconds. 6. Slowly release and straighten your leg. Pelvic Tilt Repeat these steps 5-10 times: 1. Lie on your back on a firm bed or the floor with your legs extended. 2. Bend your knees so they are pointing toward the ceiling and your feet are flat on the floor. 3. Tighten your lower abdominal muscles to press your lower back against the floor. This motion will tilt your pelvis so your tailbone points up toward the ceiling instead of pointing to your feet or the floor. 4. With gentle tension and even breathing, hold this position for 5-10 seconds. Cat-Cow Repeat these steps until your lower back becomes more flexible: 1. Get into a hands-and-knees position on a firm surface. Keep your hands under your  shoulders, and keep your knees under your hips. You may place padding under your knees for comfort. 2. Let your head hang down, and point your tailbone toward the floor so your lower back becomes rounded like the back of a cat. 3. Hold this position for 5 seconds. 4. Slowly lift your head and point your tailbone up toward the ceiling so your back forms a sagging arch like the back of a cow. 5. Hold this position for 5 seconds. Press-Ups Repeat these steps 5-10 times: 1. Lie on your abdomen (face-down) on the floor. 2. Place your palms near your head, about shoulder-width apart. 3. While you keep your back as relaxed as possible and keep your hips on the floor, slowly straighten your arms to raise the top half of your body and lift your shoulders. Do not use your back muscles to raise your upper torso. You may adjust the placement of your hands to make yourself more comfortable. 4. Hold this position for 5 seconds while you keep your back relaxed. 5. Slowly return to lying flat on the floor. Bridges Repeat these steps 10 times: 1. Lie on your back on a firm surface. 2. Bend your knees so they are pointing toward the ceiling and your feet are flat on the floor. 3. Tighten your buttocks muscles and lift your buttocks off of the floor until your waist is at almost the same height as your knees. You should feel the muscles working in your buttocks and the back of your thighs. If you do not feel these muscles, slide your feet 1-2 inches farther away from your buttocks. 4. Hold this position for 3-5 seconds. 5. Slowly lower your hips to the starting position, and allow your buttocks muscles to relax completely. If this exercise is too easy, try doing it with your arms crossed over your chest. Abdominal Crunches Repeat these steps 5-10 times: 1. Lie on your back on a firm bed or the floor with your legs extended. 2. Bend your knees so they are pointing toward the ceiling and your feet are flat on the  floor. 3. Cross your arms over your chest. 4. Tip your chin slightly toward your chest without bending your neck. 5. Tighten your abdominal muscles and slowly raise your trunk (torso) high enough to lift your shoulder blades a tiny bit off of the floor. Avoid raising your torso higher than that, because it can put too much stress on your low back and it does not help to strengthen your abdominal muscles. 6. Slowly return to your starting position. Back Lifts Repeat these steps 5-10 times: 1. Lie on your abdomen (face-down) with your arms at your sides, and rest your forehead on the floor. 2.  Tighten the muscles in your legs and your buttocks. 3. Slowly lift your chest off of the floor while you keep your hips pressed to the floor. Keep the back of your head in line with the curve in your back. Your eyes should be looking at the floor. 4. Hold this position for 3-5 seconds. 5. Slowly return to your starting position. SEEK MEDICAL CARE IF:  Your back pain or discomfort gets much worse when you do an exercise.  Your back pain or discomfort does not lessen within 2 hours after you exercise. If you have any of these problems, stop doing these exercises right away. Do not do them again unless your health care provider says that you can. SEEK IMMEDIATE MEDICAL CARE IF:  You develop sudden, severe back pain. If this happens, stop doing the exercises right away. Do not do them again unless your health care provider says that you can.   This information is not intended to replace advice given to you by your health care provider. Make sure you discuss any questions you have with your health care provider.   Document Released: 11/17/2004 Document Revised: 07/01/2015 Document Reviewed: 12/04/2014 Elsevier Interactive Patient Education [employer]2016 [employer] Inc[employer]     Return if symptoms worsen or fail to improve.   Mashawn Brazil, MD 09/22/2015

## 2015-09-22 NOTE — Patient Instructions (Signed)
Take the diclofenac anti-inflammatory pain reliever one twice daily at breakfast and supper  In addition to that you can take extra strength Tylenol 500 mg 2 pills 3 times daily if needed for worse pain  Take the cyclobenzaprine 5 mg muscle relaxant one at bedtime  If not doing better in the next 2 weeks or so we may need to refer you to physical therapy  Try to do some of the below mentioned exercises and stretches 2-3 times daily.  Always squat and lift from your knees, not lifting by bending over and lifting.  Back Exercises The following exercises strengthen the muscles that help to support the back. They also help to keep the lower back flexible. Doing these exercises can help to prevent back pain or lessen existing pain. If you have back pain or discomfort, try doing these exercises 2-3 times each day or as told by your health care provider. When the pain goes away, do them once each day, but increase the number of times that you repeat the steps for each exercise (do more repetitions). If you do not have back pain or discomfort, do these exercises once each day or as told by your health care provider. EXERCISES Single Knee to Chest Repeat these steps 3-5 times for each leg: 1. Lie on your back on a firm bed or the floor with your legs extended. 2. Bring one knee to your chest. Your other leg should stay extended and in contact with the floor. 3. Hold your knee in place by grabbing your knee or thigh. 4. Pull on your knee until you feel a gentle stretch in your lower back. 5. Hold the stretch for 10-30 seconds. 6. Slowly release and straighten your leg. Pelvic Tilt Repeat these steps 5-10 times: 1. Lie on your back on a firm bed or the floor with your legs extended. 2. Bend your knees so they are pointing toward the ceiling and your feet are flat on the floor. 3. Tighten your lower abdominal muscles to press your lower back against the floor. This motion will tilt your pelvis so  your tailbone points up toward the ceiling instead of pointing to your feet or the floor. 4. With gentle tension and even breathing, hold this position for 5-10 seconds. Cat-Cow Repeat these steps until your lower back becomes more flexible: 1. Get into a hands-and-knees position on a firm surface. Keep your hands under your shoulders, and keep your knees under your hips. You may place padding under your knees for comfort. 2. Let your head hang down, and point your tailbone toward the floor so your lower back becomes rounded like the back of a cat. 3. Hold this position for 5 seconds. 4. Slowly lift your head and point your tailbone up toward the ceiling so your back forms a sagging arch like the back of a cow. 5. Hold this position for 5 seconds. Press-Ups Repeat these steps 5-10 times: 1. Lie on your abdomen (face-down) on the floor. 2. Place your palms near your head, about shoulder-width apart. 3. While you keep your back as relaxed as possible and keep your hips on the floor, slowly straighten your arms to raise the top half of your body and lift your shoulders. Do not use your back muscles to raise your upper torso. You may adjust the placement of your hands to make yourself more comfortable. 4. Hold this position for 5 seconds while you keep your back relaxed. 5. Slowly return to lying flat on  the floor. Bridges Repeat these steps 10 times: 1. Lie on your back on a firm surface. 2. Bend your knees so they are pointing toward the ceiling and your feet are flat on the floor. 3. Tighten your buttocks muscles and lift your buttocks off of the floor until your waist is at almost the same height as your knees. You should feel the muscles working in your buttocks and the back of your thighs. If you do not feel these muscles, slide your feet 1-2 inches farther away from your buttocks. 4. Hold this position for 3-5 seconds. 5. Slowly lower your hips to the starting position, and allow your  buttocks muscles to relax completely. If this exercise is too easy, try doing it with your arms crossed over your chest. Abdominal Crunches Repeat these steps 5-10 times: 1. Lie on your back on a firm bed or the floor with your legs extended. 2. Bend your knees so they are pointing toward the ceiling and your feet are flat on the floor. 3. Cross your arms over your chest. 4. Tip your chin slightly toward your chest without bending your neck. 5. Tighten your abdominal muscles and slowly raise your trunk (torso) high enough to lift your shoulder blades a tiny bit off of the floor. Avoid raising your torso higher than that, because it can put too much stress on your low back and it does not help to strengthen your abdominal muscles. 6. Slowly return to your starting position. Back Lifts Repeat these steps 5-10 times: 1. Lie on your abdomen (face-down) with your arms at your sides, and rest your forehead on the floor. 2. Tighten the muscles in your legs and your buttocks. 3. Slowly lift your chest off of the floor while you keep your hips pressed to the floor. Keep the back of your head in line with the curve in your back. Your eyes should be looking at the floor. 4. Hold this position for 3-5 seconds. 5. Slowly return to your starting position. SEEK MEDICAL CARE IF:  Your back pain or discomfort gets much worse when you do an exercise.  Your back pain or discomfort does not lessen within 2 hours after you exercise. If you have any of these problems, stop doing these exercises right away. Do not do them again unless your health care provider says that you can. SEEK IMMEDIATE MEDICAL CARE IF:  You develop sudden, severe back pain. If this happens, stop doing the exercises right away. Do not do them again unless your health care provider says that you can.   This information is not intended to replace advice given to you by your health care provider. Make sure you discuss any questions you have  with your health care provider.   Document Released: 11/17/2004 Document Revised: 07/01/2015 Document Reviewed: 12/04/2014 Elsevier Interactive Patient Education [employer]

## 2015-11-05 ENCOUNTER — Ambulatory Visit (INDEPENDENT_AMBULATORY_CARE_PROVIDER_SITE_OTHER): Payer: 59 | Admitting: Family Medicine

## 2015-11-05 VITALS — BP 125/85 | HR 79 | Temp 98.3°F | Resp 16 | Ht 64.5 in | Wt 239.6 lb

## 2015-11-05 DIAGNOSIS — Z113 Encounter for screening for infections with a predominantly sexual mode of transmission: Secondary | ICD-10-CM | POA: Diagnosis not present

## 2015-11-05 DIAGNOSIS — Z0000 Encounter for general adult medical examination without abnormal findings: Secondary | ICD-10-CM

## 2015-11-05 DIAGNOSIS — Z1329 Encounter for screening for other suspected endocrine disorder: Secondary | ICD-10-CM | POA: Diagnosis not present

## 2015-11-05 DIAGNOSIS — R319 Hematuria, unspecified: Secondary | ICD-10-CM

## 2015-11-05 DIAGNOSIS — Z131 Encounter for screening for diabetes mellitus: Secondary | ICD-10-CM

## 2015-11-05 DIAGNOSIS — I10 Essential (primary) hypertension: Secondary | ICD-10-CM | POA: Diagnosis not present

## 2015-11-05 DIAGNOSIS — Z23 Encounter for immunization: Secondary | ICD-10-CM

## 2015-11-05 DIAGNOSIS — Z130 Encounter for screening for diseases of the blood and blood-forming organs and certain disorders involving the immune mechanism: Secondary | ICD-10-CM | POA: Diagnosis not present

## 2015-11-05 DIAGNOSIS — Z13220 Encounter for screening for lipoid disorders: Secondary | ICD-10-CM | POA: Diagnosis not present

## 2015-11-05 LAB — POCT URINALYSIS DIP (MANUAL ENTRY)
Bilirubin, UA: NEGATIVE
Glucose, UA: NEGATIVE
Ketones, POC UA: NEGATIVE
Leukocytes, UA: NEGATIVE
Nitrite, UA: NEGATIVE
Protein Ur, POC: NEGATIVE
Spec Grav, UA: 1.02
Urobilinogen, UA: 0.2
pH, UA: 6.5

## 2015-11-05 LAB — POC MICROSCOPIC URINALYSIS (UMFC): Mucus: ABSENT

## 2015-11-05 LAB — LIPID PANEL
Cholesterol: 157 mg/dL (ref 125–200)
HDL: 27 mg/dL — ABNORMAL LOW (ref >=40)
LDL Cholesterol: 98 mg/dL (ref <130)
Total CHOL/HDL Ratio: 5.8 Ratio — ABNORMAL HIGH (ref <=5.0)
Triglycerides: 159 mg/dL — ABNORMAL HIGH (ref <150)
VLDL: 32 mg/dL — ABNORMAL HIGH (ref <30)

## 2015-11-05 LAB — COMPLETE METABOLIC PANEL WITHOUT GFR
Alkaline Phosphatase: 83 U/L (ref 40–115)
CO2: 23 mmol/L (ref 20–31)
GFR, Est Non African American: 81 mL/min (ref >=60)
Glucose, Bld: 86 mg/dL (ref 65–99)
Potassium: 3.9 mmol/L (ref 3.5–5.3)
Sodium: 137 mmol/L (ref 135–146)

## 2015-11-05 LAB — COMPLETE METABOLIC PANEL WITH GFR
ALT: 39 U/L (ref 9–46)
AST: 30 U/L (ref 10–40)
Albumin: 4.2 g/dL (ref 3.6–5.1)
BUN: 14 mg/dL (ref 7–25)
Calcium: 9.1 mg/dL (ref 8.6–10.3)
Chloride: 105 mmol/L (ref 98–110)
Creat: 1.13 mg/dL (ref 0.60–1.35)
GFR, Est African American: >89 mL/min (ref >=60)
Total Bilirubin: 0.6 mg/dL (ref 0.2–1.2)
Total Protein: 7.1 g/dL (ref 6.1–8.1)

## 2015-11-05 LAB — POCT CBC
Granulocyte percent: 61.4 % (ref 37–80)
HCT, POC: 44.6 % (ref 43.5–53.7)
Hemoglobin: 15 g/dL (ref 14.1–18.1)
Lymph, poc: 2.6 (ref 0.6–3.4)
MCH, POC: 28.6 pg (ref 27–31.2)
MCHC: 33.6 g/dL (ref 31.8–35.4)
MCV: 85.1 fL (ref 80–97)
MID (cbc): 0.6 (ref 0–0.9)
MPV: 7.4 fL (ref 0–99.8)
POC Granulocyte: 5.1 (ref 2–6.9)
POC LYMPH PERCENT: 31.4 %L (ref 10–50)
POC MID %: 7.2 %M (ref 0–12)
Platelet Count, POC: 264 10*3/uL (ref 142–424)
RBC: 5.24 M/uL (ref 4.69–6.13)
RDW, POC: 13.9 %
WBC: 8.3 10*3/uL (ref 4.6–10.2)

## 2015-11-05 LAB — POCT GLYCOSYLATED HEMOGLOBIN (HGB A1C): Hemoglobin A1C: 6.4

## 2015-11-05 MED ORDER — AMLODIPINE BESYLATE 5 MG PO TABS: 5.0000 mg | ORAL_TABLET | Freq: Every day | ORAL | Status: DC

## 2015-11-05 NOTE — Progress Notes (Signed)
Chief Complaint:  Chief Complaint  Patient presents with  . Employment Physical    paper work in folder    HPI: Paul [name] is a 40 y.o. male who reports to [facility] today complaining of annual PE without any complaints.  Quit most tobacco use since  January 5, he has about 1 cig per week.  He used to smoke 1 cig Am and PM before and after work, he states it is habit He has not had his tetanus shot He has kids in they are 82, 20 HE is sexually active, monagamous relationship, has sex with just women  No prior hx of STDs, no urinary sxs or dc Exercising , eating low fat diest, does nto eat a lot of red meats He has had hematuria for last 2 years that we know of  HTN well controlled Denies SEs  BP Readings from Last 3 Encounters:  11/05/15 125/85  09/22/15 132/88  09/14/15 130/86     Past Medical History  Diagnosis Date  . Hypertension    Past Surgical History  Procedure Laterality Date  . Mandible surgery     Social History   Social History  . Marital Status: Married    Spouse Name: N/A  . Number of Children: N/A  . Years of Education: N/A   Social History Main Topics  . Smoking status: Current Every Day Smoker    Types: Cigarettes  . Smokeless tobacco: None  . Alcohol Use: Yes     Comment: socially  . Drug Use: No  . Sexual Activity: Yes   Other Topics Concern  . None   Social History Narrative   Family History  Problem Relation Age of Onset  . Lung cancer Mother   . Breast cancer Mother   . Cancer Mother   . Cancer Father    No Known Allergies Prior to Admission medications   Medication Sig Start Date End Date Taking? Authorizing Provider  amLODipine (NORVASC) 5 MG tablet TAKE 1 TABLET BY MOUTH DAILY 07/31/15  Yes Peyton Najjar, MD  cyclobenzaprine (FLEXERIL) 5 MG tablet Take 1 tablet (5 mg total) by mouth at bedtime. Patient not taking: Reported on 11/05/2015 09/22/15   Peyton Najjar, MD  diclofenac (VOLTAREN) 75 MG EC tablet Take  1 tablet (75 mg total) by mouth 2 (two) times daily. Patient not taking: Reported on 11/05/2015 09/22/15   Peyton Najjar, MD     ROS: The patient denies fevers, chills, night sweats, unintentional weight loss, chest pain, palpitations, wheezing, dyspnea on exertion, nausea, vomiting, abdominal pain, dysuria, hematuria, melena, numbness, weakness, or tingling.   All other systems have been reviewed and were otherwise negative with the exception of those mentioned in the HPI and as above.    PHYSICAL EXAM: Filed Vitals:   11/05/15 1450  BP: 125/85  Pulse: 79  Temp: 98.3 F (36.8 C)  Resp: 16   Body mass index is 40.51 kg/(m^2).   General: Alert, no acute distress HEENT:  Normocephalic, atraumatic, oropharynx patent. EOMI, PERRLA, fundo exam normal , TM normal  Cardiovascular:  Regular rate and rhythm, no rubs murmurs or gallops.  No Carotid bruits, radial pulse intact. No pedal edema.  Respiratory: Clear to auscultation bilaterally.  No wheezes, rales, or rhonchi.  No cyanosis, no use of accessory musculature Abdominal: No organomegaly, abdomen is soft and non-tender, positive bowel sounds. No masses. Skin: No rashes. Neurologic: Facial musculature symmetric. Psychiatric: Patient acts appropriately throughout our interaction.  Lymphatic: No cervical or submandibular lymphadenopathy Musculoskeletal: Gait intact. No edema, tenderness GU-neg inguinal hernia, no dc, no rashes, testicular exam normal  5/5 strength, UE and Raksha Wolfgang nl    LABS: Results for orders placed or performed in visit on 09/14/15  POCT urinalysis dipstick  Result Value Ref Range   Color, UA yellow yellow   Clarity, UA clear clear   Glucose, UA negative negative   Bilirubin, UA negative negative   Ketones, POC UA negative negative   Spec Grav, UA 1.025    Blood, UA moderate (A) negative   pH, UA 6.0    Protein Ur, POC trace (A) negative   Urobilinogen, UA 1.0    Nitrite, UA Negative Negative   Leukocytes, UA  Negative Negative     EKG/XRAY:   Primary [facility]   ASSESSMENT/PLAN: Encounter Diagnoses  Name Primary?  . Annual physical exam Yes  . Essential hypertension   . Hematuria   . Screening for STD (sexually transmitted disease)   . Screening for deficiency anemia   . Screening for thyroid disorder   . Screening for hyperlipidemia   . Screening for diabetes mellitus   . Need for prophylactic vaccination with tetanus-diphtheria (TD)    40 y/o male with PMH  HTN, hematuria, who is here for annual PE and also STD screening He is doing well, no complaints Will refer to urology since has had heamturia x 2 years, smoker Annual labs pending, STD panel pending Fu prn    Gross sideeffects, risk and benefits, and alternatives of medications d/w patient. Patient is aware that all medications have potential sideeffects and we are unable to predict every sideeffect or drug-drug interaction that may occur.  Anarely Nicholls DO  11/05/2015 4:02 PM

## 2015-11-06 LAB — GC/CHLAMYDIA PROBE AMP
CT Probe RNA: NOT DETECTED
GC Probe RNA: NOT DETECTED

## 2015-11-06 LAB — HIV ANTIBODY (ROUTINE TESTING W REFLEX): HIV 1&2 Ab, 4th Generation: NONREACTIVE

## 2015-11-06 LAB — RPR

## 2015-11-06 LAB — TSH: TSH: 1.834 u[IU]/mL (ref 0.350–4.500)

## 2015-11-17 ENCOUNTER — Telehealth: Payer: Self-pay

## 2015-11-17 NOTE — Telephone Encounter (Signed)
Paul [name] from pt's ins company called stating that pt came in with a form, but he didn't send it back with the lab work. After speaking with pt and getting confirmation, faxed labs to pt's ins company (only FLP, Glucose and A1C)

## 2016-01-06 ENCOUNTER — Encounter: Payer: Self-pay | Admitting: Family Medicine

## 2016-12-06 ENCOUNTER — Ambulatory Visit (INDEPENDENT_AMBULATORY_CARE_PROVIDER_SITE_OTHER): Payer: 59 | Admitting: Student

## 2016-12-06 VITALS — BP 134/88 | HR 100 | Temp 98.6°F | Resp 16 | Ht 64.5 in | Wt 242.8 lb

## 2016-12-06 DIAGNOSIS — A084 Viral intestinal infection, unspecified: Secondary | ICD-10-CM

## 2016-12-06 MED ORDER — ACETAMINOPHEN-CODEINE #3 300-30 MG PO TABS: 1.0000 | ORAL_TABLET | Freq: Three times a day (TID) | ORAL | 0 refills | Status: DC | PRN

## 2016-12-06 MED ORDER — NAPROXEN 500 MG PO TABS: 500.0000 mg | ORAL_TABLET | Freq: Two times a day (BID) | ORAL | 0 refills | Status: DC

## 2016-12-06 NOTE — Progress Notes (Signed)
   Subjective:    Patient ID: Paul [name], male    DOB: 1976-05-07, 41 y.o.   MRN: [id number]  HPI Has diarrhea, vomiting, nausea, fevers, chills.  Started last night, no other sick contacts.  Has had watery, nonbloody diarrhea 4 times since yesterday.  He is having abdominal cramps as well.  He has vomited once, nonbloody, nonbilious.  He has been drinking some fluids, but not much. He has been unable to go to work.   PMHx - Updated and reviewed.  Contributory factors include: HTN PSHx - Updated and reviewed.  Contributory factors include:  Negative FHx - Updated and reviewed.  Contributory factors include:  Negative Social Hx - Updated and reviewed. Contributory factors include: Smoker Medications - reviewed   Review of Systems  Constitutional: Positive for activity change, appetite change, chills, fatigue and fever.  HENT: Negative for congestion, ear pain and sore throat.   Eyes: Negative for pain and itching.  Respiratory: Negative for cough and shortness of breath.   Cardiovascular: Negative for chest pain and leg swelling.  Gastrointestinal: Positive for abdominal pain, diarrhea, nausea and vomiting. Negative for abdominal distention, blood in stool and rectal pain.  Genitourinary: Negative for dysuria and urgency.  Musculoskeletal: Positive for myalgias. Negative for joint swelling.  Skin: Negative for pallor, rash and wound.  Neurological: Negative for dizziness and weakness.  All other systems reviewed and are negative.      Objective:   Physical Exam  Constitutional: He is oriented to person, place, and time. He appears well-developed and well-nourished. No distress.  HENT:  Head: Normocephalic and atraumatic.  Eyes: Conjunctivae are normal. No scleral icterus.  Neck: Normal range of motion. Neck supple.  Cardiovascular: Normal rate and regular rhythm.  Exam reveals no gallop and no friction rub.   No murmur heard. Pulmonary/Chest: Effort normal and breath sounds  normal. No respiratory distress.  Abdominal: Soft.  Diffuse abdominal pain, hyperechoic bowel sounds, no rebound or guarding  Musculoskeletal: He exhibits no edema or deformity.  Neurological: He is alert and oriented to person, place, and time.  Skin: Skin is warm. No rash noted. He is not diaphoretic. No erythema. No pallor.  Psychiatric: He has a normal mood and affect. His behavior is normal. Judgment and thought content normal.   BP 134/88   Pulse 100   Temp 98.6 F (37 C) (Oral)   Resp 16   Ht 5' 4.5" (1.638 m)   Wt 242 lb 12.8 oz (110.1 kg)   SpO2 97%   BMI 41.03 kg/m         Assessment & Plan:  Viral enteritis Recommend increasing fluids, symptomatic care.  Avoid sharing towels with other and frequent handwashing.  Follow up if unable to keep down fluids or changes in symptoms. Signed,  Corliss MarcusAlicia Barnes, DO [facility] 7:17 PM 12/06/16

## 2016-12-06 NOTE — Patient Instructions (Addendum)
  Make sure to drink plenty of fluids.     IF you received an x-ray today, you will receive an invoice from [facility]. Please contact [facility] at [phone] with questions or concerns regarding your invoice.   IF you received labwork today, you will receive an invoice from [facility]. Please contact [facility] at [phone] with questions or concerns regarding your invoice.   Our billing staff will not be able to assist you with questions regarding bills from these companies.  You will be contacted with the lab results as soon as they are available. The fastest way to get your results is to activate your My Chart account. Instructions are located on the last page of this paperwork. If you have not heard from us regarding the results in 2 weeks, please contact this office.

## 2016-12-07 DIAGNOSIS — A084 Viral intestinal infection, unspecified: Secondary | ICD-10-CM | POA: Insufficient documentation

## 2016-12-07 NOTE — Assessment & Plan Note (Signed)
Recommend increasing fluids, symptomatic care.  Avoid sharing towels with other and frequent handwashing.  Follow up if unable to keep down fluids or changes in symptoms.

## 2016-12-09 ENCOUNTER — Other Ambulatory Visit: Payer: Self-pay

## 2016-12-09 MED ORDER — AMLODIPINE BESYLATE 5 MG PO TABS: 5.0000 mg | ORAL_TABLET | Freq: Every day | ORAL | 0 refills | Status: DC

## 2016-12-09 NOTE — Telephone Encounter (Signed)
Fax req [facility] [address] Amlodipine Sent 30 days.  Note to follow up for CPE SPoke with pt - wants rx transferred to [facility] - transferred to make appt for tomorrow

## 2016-12-12 ENCOUNTER — Ambulatory Visit: Payer: 59

## 2017-01-05 ENCOUNTER — Ambulatory Visit (INDEPENDENT_AMBULATORY_CARE_PROVIDER_SITE_OTHER): Payer: 59 | Admitting: Physician Assistant

## 2017-01-05 VITALS — BP 140/96 | HR 79 | Temp 98.5°F | Resp 18 | Ht 64.5 in | Wt 241.8 lb

## 2017-01-05 DIAGNOSIS — Z1389 Encounter for screening for other disorder: Secondary | ICD-10-CM | POA: Diagnosis not present

## 2017-01-05 DIAGNOSIS — Z131 Encounter for screening for diabetes mellitus: Secondary | ICD-10-CM | POA: Diagnosis not present

## 2017-01-05 DIAGNOSIS — Z1159 Encounter for screening for other viral diseases: Secondary | ICD-10-CM

## 2017-01-05 DIAGNOSIS — Z114 Encounter for screening for human immunodeficiency virus [HIV]: Secondary | ICD-10-CM | POA: Diagnosis not present

## 2017-01-05 DIAGNOSIS — Z126 Encounter for screening for malignant neoplasm of bladder: Secondary | ICD-10-CM | POA: Diagnosis not present

## 2017-01-05 DIAGNOSIS — Z1329 Encounter for screening for other suspected endocrine disorder: Secondary | ICD-10-CM

## 2017-01-05 DIAGNOSIS — Z113 Encounter for screening for infections with a predominantly sexual mode of transmission: Secondary | ICD-10-CM

## 2017-01-05 DIAGNOSIS — Z13220 Encounter for screening for lipoid disorders: Secondary | ICD-10-CM

## 2017-01-05 DIAGNOSIS — I10 Essential (primary) hypertension: Secondary | ICD-10-CM | POA: Diagnosis not present

## 2017-01-05 DIAGNOSIS — Z9189 Other specified personal risk factors, not elsewhere classified: Secondary | ICD-10-CM

## 2017-01-05 DIAGNOSIS — Z130 Encounter for screening for diseases of the blood and blood-forming organs and certain disorders involving the immune mechanism: Secondary | ICD-10-CM | POA: Diagnosis not present

## 2017-01-05 DIAGNOSIS — Z0000 Encounter for general adult medical examination without abnormal findings: Secondary | ICD-10-CM | POA: Diagnosis not present

## 2017-01-05 LAB — POCT URINALYSIS DIP (MANUAL ENTRY)
Bilirubin, UA: NEGATIVE
GLUCOSE UA: NEGATIVE
Ketones, POC UA: NEGATIVE
LEUKOCYTES UA: NEGATIVE
NITRITE UA: NEGATIVE
PROTEIN UA: NEGATIVE
SPEC GRAV UA: 1.02 (ref 1.030–1.035)
UROBILINOGEN UA: 0.2 (ref ?–2.0)
pH, UA: 5.5 (ref 5.0–8.0)

## 2017-01-05 MED ORDER — ASPIRIN 81 MG PO CHEW: 81.0000 mg | CHEWABLE_TABLET | Freq: Every day | ORAL | 3 refills | Status: DC

## 2017-01-05 MED ORDER — VARENICLINE TARTRATE 1 MG PO TABS
1.0000 mg | ORAL_TABLET | Freq: Two times a day (BID) | ORAL | 3 refills | Status: DC
Start: 2017-01-05 — End: 2017-11-29

## 2017-01-05 MED ORDER — VARENICLINE TARTRATE 0.5 MG X 11 & 1 MG X 42 PO MISC
ORAL | 0 refills | Status: DC
Start: 2017-01-05 — End: 2017-11-29

## 2017-01-05 MED ORDER — ROSUVASTATIN CALCIUM 20 MG PO TABS
20.0000 mg | ORAL_TABLET | Freq: Every day | ORAL | 3 refills | Status: DC
Start: 2017-01-05 — End: 2018-01-08

## 2017-01-05 MED ORDER — AMLODIPINE BESYLATE 10 MG PO TABS: 10.0000 mg | ORAL_TABLET | Freq: Every day | ORAL | 3 refills | Status: DC

## 2017-01-05 NOTE — Progress Notes (Signed)
01/06/2017 9:39 AM   DOB: September 23, 1976 / MRN: [id number]  SUBJECTIVE:  Paul [name] is a 41 y.o. male presenting for annual exam and he has some forms to be signed regarding his insurance.  He has a history of HTN, prediabetes, and smoking. He is not exercising.   Immunization History  Administered Date(s) Administered  . Tdap 11/05/2015     He has No Known Allergies.   He  has a past medical history of Hypertension.    He  reports that he has been smoking Cigarettes.  He has never used smokeless tobacco. He reports that he drinks alcohol. He reports that he does not use drugs. He  reports that he currently engages in sexual activity. The patient  has a past surgical history that includes Mandible surgery and Fracture surgery.  His family history includes Breast cancer in his mother; Cancer in his father and mother; Lung cancer in his mother.  Review of Systems  Constitutional: Negative for chills and fever.  Cardiovascular: Negative for chest pain and leg swelling.  Musculoskeletal: Negative for myalgias.  Skin: Negative for itching and rash.  Neurological: Negative for dizziness.    The problem list and medications were reviewed and updated by myself where necessary and exist elsewhere in the encounter.   OBJECTIVE:  BP (!) 140/96   Pulse 79   Temp 98.5 F (36.9 C) (Oral)   Resp 18   Ht 5' 4.5" (1.638 m)   Wt 241 lb 12.8 oz (109.7 kg)   SpO2 96%   BMI 40.86 kg/m   Physical Exam  Cardiovascular: Normal rate, regular rhythm, normal heart sounds and intact distal pulses.  Exam reveals no gallop and no friction rub.   Pulmonary/Chest: Effort normal and breath sounds normal.  Musculoskeletal: Normal range of motion. He exhibits no edema, tenderness or deformity.   Wt Readings from Last 3 Encounters:  01/05/17 241 lb 12.8 oz (109.7 kg)  12/06/16 242 lb 12.8 oz (110.1 kg)  11/05/15 239 lb 9.6 oz (108.7 kg)    Results for orders placed or performed in visit on  01/05/17 (from the past 72 hour(s))  CBC     Status: None   Collection Time: 01/05/17  4:30 PM  Result Value Ref Range   WBC 9.2 3.4 - 10.8 x10E3/uL   RBC 5.12 4.14 - 5.80 x10E6/uL   Hemoglobin 14.8 13.0 - 17.7 g/dL   Hematocrit 44.4 37.5 - 51.0 %   MCV 87 79 - 97 fL   MCH 28.9 26.6 - 33.0 pg   MCHC 33.3 31.5 - 35.7 g/dL   RDW 14.7 12.3 - 15.4 %   Platelets 279 150 - 379 x10E3/uL  CMP14+EGFR     Status: Abnormal   Collection Time: 01/05/17  4:30 PM  Result Value Ref Range   Glucose 93 65 - 99 mg/dL   BUN 16 6 - 24 mg/dL   Creatinine, Ser 1.08 0.76 - 1.27 mg/dL   GFR calc non Af Amer 85 >59 mL/min/1.73   GFR calc Af Amer 98 >59 mL/min/1.73   BUN/Creatinine Ratio 15 9 - 20   Sodium 139 134 - 144 mmol/L   Potassium 3.8 3.5 - 5.2 mmol/L   Chloride 99 96 - 106 mmol/L   CO2 23 18 - 29 mmol/L   Calcium 9.3 8.7 - 10.2 mg/dL   Total Protein 7.3 6.0 - 8.5 g/dL   Albumin 4.3 3.5 - 5.5 g/dL   Globulin, Total 3.0 1.5 - 4.5 g/dL  Albumin/Globulin Ratio 1.4 1.2 - 2.2   Bilirubin Total 0.4 0.0 - 1.2 mg/dL   Alkaline Phosphatase 99 39 - 117 IU/L   AST 45 (H) 0 - 40 IU/L   ALT 44 0 - 44 IU/L  Lipid panel     Status: Abnormal   Collection Time: 01/05/17  4:30 PM  Result Value Ref Range   Cholesterol, Total 174 100 - 199 mg/dL   Triglycerides 200 (H) 0 - 149 mg/dL   HDL 39 (L) >39 mg/dL   VLDL Cholesterol Cal 40 5 - 40 mg/dL   LDL Calculated 95 0 - 99 mg/dL   Chol/HDL Ratio 4.5 0.0 - 5.0 ratio units    Comment:                                   T. Chol/HDL Ratio                                             Men  Women                               1/2 Avg.Risk  3.4    3.3                                   Avg.Risk  5.0    4.4                                2X Avg.Risk  9.6    7.1                                3X Avg.Risk 23.4   11.0   TSH     Status: None (Preliminary result)   Collection Time: 01/05/17  4:30 PM  Result Value Ref Range   TSH WILL FOLLOW   Hemoglobin A1c     Status:  Abnormal   Collection Time: 01/05/17  4:30 PM  Result Value Ref Range   Hgb A1c MFr Bld 6.1 (H) 4.8 - 5.6 %    Comment:          Pre-diabetes: 5.7 - 6.4          Diabetes: >6.4          Glycemic control for adults with diabetes: <7.0    Est. average glucose Bld gHb Est-mCnc 128 mg/dL  HIV antibody     Status: None   Collection Time: 01/05/17  4:30 PM  Result Value Ref Range   HIV Screen 4th Generation wRfx Non Reactive Non Reactive  Hepatitis B surface antibody     Status: Abnormal   Collection Time: 01/05/17  4:30 PM  Result Value Ref Range   Hepatitis B Surf Ab Quant <3.1 (L) Immunity>9.9 mIU/mL    Comment:   Status of Immunity                     Anti-HBs Level   ------------------                     --------------  Inconsistent with Immunity                   0.0 - 9.9 Consistent with Immunity                          >9.9   Hepatitis B surface antigen     Status: None   Collection Time: 01/05/17  4:30 PM  Result Value Ref Range   Hepatitis B Surface Ag Negative Negative  RPR     Status: None   Collection Time: 01/05/17  4:30 PM  Result Value Ref Range   RPR Ser Ql Non Reactive Non Reactive  Hepatitis C antibody     Status: None   Collection Time: 01/05/17  4:30 PM  Result Value Ref Range   Hep C Virus Ab <0.1 0.0 - 0.9 s/co ratio    Comment:                                   Negative:     < 0.8                              Indeterminate: 0.8 - 0.9                                   Positive:     > 0.9  The CDC recommends that a positive HCV antibody result  be followed up with a HCV Nucleic Acid Amplification  test (827078).   POCT urinalysis dipstick     Status: Abnormal   Collection Time: 01/05/17  4:33 PM  Result Value Ref Range   Color, UA yellow yellow   Clarity, UA clear clear   Glucose, UA negative negative   Bilirubin, UA negative negative   Ketones, POC UA negative negative   Spec Grav, UA 1.020 1.030 - 1.035   Blood, UA small (A) negative   pH, UA 5.5  5.0 - 8.0   Protein Ur, POC negative negative   Urobilinogen, UA 0.2 Negative - 2.0   Nitrite, UA Negative Negative   Leukocytes, UA Negative Negative    No results found.  Lab Results  Component Value Date   CHOL 174 01/05/2017   HDL 39 (L) 01/05/2017   LDLCALC 95 01/05/2017   TRIG 200 (H) 01/05/2017   CHOLHDL 4.5 01/05/2017   Lab Results  Component Value Date   HGBA1C 6.1 (H) 01/05/2017   Lab Results  Component Value Date   CREATININE 1.08 01/05/2017   BUN 16 01/05/2017   NA 139 01/05/2017   K 3.8 01/05/2017   CL 99 01/05/2017   CO2 23 01/05/2017    ASSESSMENT AND PLAN:  Paul [name] was seen today for annual exam.  Diagnoses and all orders for this visit:  Annual physical exam: I have billed for both the physical and for a problem visit. He needs help.  He ASCVD risk is greater than 20% in the next ten years and he is only 42.  I am starting him on crestor, ASA and will have him see Dr. Nadyne Coombes in cards for a second opinion.  I think at the minimum he would be benefit from an EKG and a stress test.    Screening for HIV (human immunodeficiency virus) -  HIV antibody  Screening for diabetes mellitus -     Hemoglobin A1c  Screening for thyroid disorder -     TSH  Screening for lipid disorders -     Lipid panel  Screening for nephropathy -     CMP14+EGFR  Screening for deficiency anemia -     CBC  Need for hepatitis B screening test -     Hepatitis B surface antibody -     Hepatitis B surface antigen  Need for hepatitis C screening test -     Hepatitis C antibody  Screening for bladder cancer -     POCT urinalysis dipstick  Screening examination for STD (sexually transmitted disease) -     GC/Chlamydia Probe Amp -     RPR  Risk for coronary artery disease greater than 20% in next 10 years: Risk at 21.9% using last years lipid panel and assuming he has progressed to an A1c of greater than 6.4.    Essential Hypertension: -     amLODipine (NORVASC) 10  MG tablet; Take 1 tablet (10 mg total) by mouth daily.    The patient is advised to call or return to clinic if he does not see an improvement in symptoms, or to seek the care of the closest emergency department if he worsens with the above plan.   Philis Fendt, MHS, PA-C [facility] 01/06/2017 9:39 AM

## 2017-01-05 NOTE — Patient Instructions (Signed)
     IF you received an x-ray today, you will receive an invoice from [facility]. Please contact [facility] at [phone] with questions or concerns regarding your invoice.   IF you received labwork today, you will receive an invoice from [facility]. Please contact [facility] at [phone] with questions or concerns regarding your invoice.   Our billing staff will not be able to assist you with questions regarding bills from these companies.  You will be contacted with the lab results as soon as they are available. The fastest way to get your results is to activate your My Chart account. Instructions are located on the last page of this paperwork. If you have not heard from us regarding the results in 2 weeks, please contact this office.     

## 2017-01-06 LAB — HEPATITIS B SURFACE ANTIGEN: HEP B S AG: NEGATIVE

## 2017-01-06 LAB — CMP14+EGFR
A/G RATIO: 1.4 (ref 1.2–2.2)
ALK PHOS: 99 IU/L (ref 39–117)
ALT: 44 IU/L (ref 0–44)
AST: 45 IU/L — AB (ref 0–40)
Albumin: 4.3 g/dL (ref 3.5–5.5)
BILIRUBIN TOTAL: 0.4 mg/dL (ref 0.0–1.2)
BUN/Creatinine Ratio: 15 (ref 9–20)
BUN: 16 mg/dL (ref 6–24)
CHLORIDE: 99 mmol/L (ref 96–106)
CO2: 23 mmol/L (ref 18–29)
Calcium: 9.3 mg/dL (ref 8.7–10.2)
Creatinine, Ser: 1.08 mg/dL (ref 0.76–1.27)
GFR calc non Af Amer: 85 mL/min/{1.73_m2} (ref >59)
GFR, EST AFRICAN AMERICAN: 98 mL/min/{1.73_m2} (ref >59)
GLUCOSE: 93 mg/dL (ref 65–99)
Globulin, Total: 3 g/dL (ref 1.5–4.5)
POTASSIUM: 3.8 mmol/L (ref 3.5–5.2)
Sodium: 139 mmol/L (ref 134–144)
Total Protein: 7.3 g/dL (ref 6.0–8.5)

## 2017-01-06 LAB — CBC
Hematocrit: 44.4 % (ref 37.5–51.0)
Hemoglobin: 14.8 g/dL (ref 13.0–17.7)
MCH: 28.9 pg (ref 26.6–33.0)
MCHC: 33.3 g/dL (ref 31.5–35.7)
MCV: 87 fL (ref 79–97)
PLATELETS: 279 10*3/uL (ref 150–379)
RBC: 5.12 x10E6/uL (ref 4.14–5.80)
RDW: 14.7 % (ref 12.3–15.4)
WBC: 9.2 10*3/uL (ref 3.4–10.8)

## 2017-01-06 LAB — GC/CHLAMYDIA PROBE AMP
CHLAMYDIA, DNA PROBE: NEGATIVE
Neisseria gonorrhoeae by PCR: NEGATIVE

## 2017-01-06 LAB — HEMOGLOBIN A1C
Est. average glucose Bld gHb Est-mCnc: 128 mg/dL
Hgb A1c MFr Bld: 6.1 % — ABNORMAL HIGH (ref 4.8–5.6)

## 2017-01-06 LAB — HEPATITIS C ANTIBODY: Hep C Virus Ab: <0.1 s/co ratio (ref 0.0–0.9)

## 2017-01-06 LAB — LIPID PANEL
Chol/HDL Ratio: 4.5 ratio units (ref 0.0–5.0)
Cholesterol, Total: 174 mg/dL (ref 100–199)
HDL: 39 mg/dL — AB (ref >39)
LDL Calculated: 95 mg/dL (ref 0–99)
TRIGLYCERIDES: 200 mg/dL — AB (ref 0–149)
VLDL Cholesterol Cal: 40 mg/dL (ref 5–40)

## 2017-01-06 LAB — RPR: RPR Ser Ql: NONREACTIVE

## 2017-01-06 LAB — TSH: TSH: 1.54 u[IU]/mL (ref 0.450–4.500)

## 2017-01-06 LAB — HIV ANTIBODY (ROUTINE TESTING W REFLEX): HIV Screen 4th Generation wRfx: NONREACTIVE

## 2017-01-06 LAB — HEPATITIS B SURFACE ANTIBODY, QUANTITATIVE

## 2017-01-11 NOTE — Progress Notes (Signed)
Please send a letter. Labs look a little better than previous with respect to diabetes.  Please follow all recommendations discussed in the office with regard to BP and statin medication. Please call [phone] to make an appointment for three months. Deliah BostonMichael Kalla Watson, MS, PA-C 2:19 PM, 01/11/2017

## 2017-01-13 ENCOUNTER — Other Ambulatory Visit: Payer: Self-pay | Admitting: Family Medicine

## 2017-05-01 ENCOUNTER — Other Ambulatory Visit: Payer: Self-pay | Admitting: Physician Assistant

## 2017-06-08 ENCOUNTER — Other Ambulatory Visit: Payer: Self-pay | Admitting: Physician Assistant

## 2017-07-04 ENCOUNTER — Other Ambulatory Visit: Payer: Self-pay | Admitting: Physician Assistant

## 2017-07-11 ENCOUNTER — Telehealth: Payer: Self-pay | Admitting: Family Medicine

## 2017-07-11 ENCOUNTER — Other Ambulatory Visit: Payer: Self-pay

## 2017-07-11 MED ORDER — AMLODIPINE BESYLATE 10 MG PO TABS: 10.0000 mg | ORAL_TABLET | Freq: Every day | ORAL | 0 refills | Status: DC

## 2017-07-11 NOTE — Telephone Encounter (Signed)
Patient called back and advised that 30 days would be sent to the pharmacy and was advised that he needs to go to his appointment to get further refills.

## 2017-07-11 NOTE — Telephone Encounter (Signed)
30 day supply sent, advised that he needs to keep his appointment to get further refills.

## 2017-07-11 NOTE — Telephone Encounter (Signed)
LVM to call back in regards to what pharmacy pt would like me to send rx too. Also advised, that patient needs to make sure that he keeps his appointment next month. Advised to call back with what pharmacy.

## 2017-08-03 ENCOUNTER — Other Ambulatory Visit: Payer: Self-pay | Admitting: Physician Assistant

## 2017-08-14 ENCOUNTER — Ambulatory Visit: Payer: 59 | Admitting: Physician Assistant

## 2017-11-29 ENCOUNTER — Ambulatory Visit: Payer: 59 | Admitting: Physician Assistant

## 2017-11-29 ENCOUNTER — Encounter: Payer: Self-pay | Admitting: Physician Assistant

## 2017-11-29 ENCOUNTER — Other Ambulatory Visit: Payer: Self-pay

## 2017-11-29 VITALS — BP 149/99 | HR 72 | Temp 98.4°F | Resp 16 | Ht 64.5 in | Wt 231.0 lb

## 2017-11-29 DIAGNOSIS — T23211A Burn of second degree of right thumb (nail), initial encounter: Secondary | ICD-10-CM | POA: Diagnosis not present

## 2017-11-29 DIAGNOSIS — I10 Essential (primary) hypertension: Secondary | ICD-10-CM

## 2017-11-29 MED ORDER — MUPIROCIN 2 % EX OINT: 1.0000 "application " | TOPICAL_OINTMENT | Freq: Two times a day (BID) | CUTANEOUS | 0 refills | Status: DC

## 2017-11-29 MED ORDER — "TELFA NON-ADHERENT 3""X4"" PADS": MEDICATED_PAD | 0 refills | Status: DC

## 2017-11-29 MED ORDER — AMLODIPINE BESYLATE 10 MG PO TABS
10.0000 mg | ORAL_TABLET | Freq: Every day | ORAL | 1 refills | Status: DC
Start: 2017-11-29 — End: 2018-01-08

## 2017-11-29 NOTE — Patient Instructions (Addendum)
  Wash the wound daily with soap and water.  Come back if it seems like it is getting worse.  For now apply the mupirocin daily along with the Telfa and keep covered.  I expect he will heal without any difficulty.   IF you received an x-ray today, you will receive an invoice from [facility]. Please contact [facility] at [phone] with questions or concerns regarding your invoice.   IF you received labwork today, you will receive an invoice from [facility]. Please contact [facility] at [phone] with questions or concerns regarding your invoice.   Our billing staff will not be able to assist you with questions regarding bills from these companies.  You will be contacted with the lab results as soon as they are available. The fastest way to get your results is to activate your My Chart account. Instructions are located on the last page of this paperwork. If you have not heard from us regarding the results in 2 weeks, please contact this office.
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
## 2017-12-06 ENCOUNTER — Telehealth: Payer: Self-pay | Admitting: Physician Assistant

## 2017-12-06 NOTE — Telephone Encounter (Signed)
Copied from CRM [phone]#[id number]. Topic: Quick Communication - See Telephone Encounter >> Dec 06, 2017  1:00 PM Rudi CocoLathan, Tyasia Packard M, NT wrote: CRM for notification. See Telephone encounter for:   12/06/17.pt. Calling to see if he can get a refill on med. Amlodipine he has misplaced medication. Pt. Can be reached at [phone]  [facility] [phone]- [place], [place] - [address] [facility] [address] [facility] [address] [place] [id number] Phone: [phone] Fax: [phone]

## 2017-12-06 NOTE — Telephone Encounter (Signed)
Informed pt that he has refills already at pharmacy he could pick up, he verbalized understanding.

## 2018-01-08 ENCOUNTER — Encounter: Payer: Self-pay | Admitting: Physician Assistant

## 2018-01-08 ENCOUNTER — Other Ambulatory Visit: Payer: Self-pay

## 2018-01-08 ENCOUNTER — Ambulatory Visit (INDEPENDENT_AMBULATORY_CARE_PROVIDER_SITE_OTHER): Payer: 59 | Admitting: Physician Assistant

## 2018-01-08 VITALS — BP 126/90 | HR 84 | Temp 99.0°F | Resp 16 | Ht 65.0 in | Wt 228.8 lb

## 2018-01-08 DIAGNOSIS — Z113 Encounter for screening for infections with a predominantly sexual mode of transmission: Secondary | ICD-10-CM

## 2018-01-08 DIAGNOSIS — I10 Essential (primary) hypertension: Secondary | ICD-10-CM | POA: Diagnosis not present

## 2018-01-08 DIAGNOSIS — Z0000 Encounter for general adult medical examination without abnormal findings: Secondary | ICD-10-CM | POA: Diagnosis not present

## 2018-01-08 DIAGNOSIS — Z9189 Other specified personal risk factors, not elsewhere classified: Secondary | ICD-10-CM

## 2018-01-08 DIAGNOSIS — F17200 Nicotine dependence, unspecified, uncomplicated: Secondary | ICD-10-CM

## 2018-01-08 DIAGNOSIS — Z13220 Encounter for screening for lipoid disorders: Secondary | ICD-10-CM

## 2018-01-08 MED ORDER — AMLODIPINE BESYLATE 10 MG PO TABS
10.0000 mg | ORAL_TABLET | Freq: Every day | ORAL | 3 refills | Status: DC
Start: 2018-01-08 — End: 2019-02-20

## 2018-01-08 MED ORDER — ROSUVASTATIN CALCIUM 20 MG PO TABS: 20.0000 mg | ORAL_TABLET | Freq: Every day | ORAL | 3 refills | Status: DC

## 2018-01-08 NOTE — Patient Instructions (Addendum)
  Stay with the blood pressure med.  Start the cholesterol med.  Keep working on the smoking and let me know if I can help.    IF you received an x-ray today, you will receive an invoice from [facility]. Please contact [facility] at [phone] with questions or concerns regarding your invoice.   IF you received labwork today, you will receive an invoice from [facility]. Please contact [facility] at [phone] with questions or concerns regarding your invoice.   Our billing staff will not be able to assist you with questions regarding bills from these companies.  You will be contacted with the lab results as soon as they are available. The fastest way to get your results is to activate your My Chart account. Instructions are located on the last page of this paperwork. If you have not heard from us regarding the results in 2 weeks, please contact this office.

## 2018-01-08 NOTE — Progress Notes (Signed)
01/08/2018 9:31 AM   DOB: October 28, 1975 / MRN: [id number]  SUBJECTIVE:  Paul [name] is a 42 y.o. male presenting for hypertension, dyslipidemia, multiple risk factors for heart disease and annual physical exam.  States that he feels well.  He would like to be screened for STI today.  CHL shows he is up-to-date on preventative care.  He denies chest pain, shortness of breath, DOE.  He is not taking his Crestor because this was not sent to the pharmacy.  He also says that Chantix is on his list but also not sent to the pharmacy.  We had this discussion last year and I advised that I would not send medication to the pharmacy without talking to him first.  Tells me he understands.   Adult vaccines due  Topic Date Due  . TETANUS/TDAP  11/04/2025   There are no preventive care reminders to display for this patient.    He has No Known Allergies.   He  has a past medical history of Hypertension.    He  reports that he has been smoking cigarettes.  he has never used smokeless tobacco. He reports that he drinks about 2.4 oz of alcohol per week. He reports that he does not use drugs. He  reports that he currently engages in sexual activity. The patient  has a past surgical history that includes Mandible surgery and Fracture surgery.  His family history includes Breast cancer in his mother; Cancer in his father and mother; Lung cancer in his mother.  Review of Systems  Constitutional: Negative for chills, diaphoresis and fever.  Respiratory: Negative for shortness of breath.   Cardiovascular: Negative for chest pain, orthopnea and leg swelling.  Gastrointestinal: Negative for nausea.  Skin: Negative for rash.  Neurological: Negative for dizziness.    The problem list and medications were reviewed and updated by myself where necessary and exist elsewhere in the encounter.   OBJECTIVE:  BP 126/90   Pulse 84   Temp 99 F (37.2 C) (Oral)   Resp 16   Ht 5\' 5"  (1.651 m)   Wt 228 lb 12.8  oz (103.8 kg)   SpO2 98%   BMI 38.07 kg/m   Physical Exam  Constitutional: He appears well-developed. He is active and cooperative.  Non-toxic appearance.  Cardiovascular: Normal rate, regular rhythm, S1 normal, S2 normal, normal heart sounds, intact distal pulses and normal pulses. Exam reveals no gallop and no friction rub.  No murmur heard. Pulmonary/Chest: Effort normal. No stridor. No tachypnea. No respiratory distress. He has no wheezes. He has no rales.  Abdominal: He exhibits no distension.  Musculoskeletal: He exhibits no edema.  Neurological: He is alert.  Skin: Skin is warm and dry. He is not diaphoretic. No pallor.  Vitals reviewed.    Lab Results  Component Value Date   WBC 9.2 01/05/2017   HGB 14.8 01/05/2017   HCT 44.4 01/05/2017   MCV 87 01/05/2017   PLT 279 01/05/2017    Lab Results  Component Value Date   CREATININE 1.08 01/05/2017   BUN 16 01/05/2017   NA 139 01/05/2017   K 3.8 01/05/2017   CL 99 01/05/2017   CO2 23 01/05/2017    Lab Results  Component Value Date   ALT 44 01/05/2017   AST 45 (H) 01/05/2017   ALKPHOS 99 01/05/2017   BILITOT 0.4 01/05/2017    Lab Results  Component Value Date   TSH 1.540 01/05/2017    Lab Results  Component Value Date   HGBA1C 6.1 (H) 01/05/2017    Lab Results  Component Value Date   CHOL 174 01/05/2017   HDL 39 (L) 01/05/2017   LDLCALC 95 01/05/2017   TRIG 200 (H) 01/05/2017   CHOLHDL 4.5 01/05/2017   The 10-year ASCVD risk score Denman George DC Jr., et al., 2013) is: 9.9%   Values used to calculate the score:     Age: 80 years     Sex: Male     Is Non-Hispanic African American: Yes     Diabetic: No     Tobacco smoker: Yes     Systolic Blood Pressure: 126 mmHg     Is BP treated: Yes     HDL Cholesterol: 39 mg/dL     Total Cholesterol: 174 mg/dL  No results found for this or any previous visit (from the past 72 hour(s)).  No results found.  ASSESSMENT AND PLAN:  Juno was seen today for  annual exam.  Diagnoses and all orders for this visit:  Essential hypertension -     Renal Function Panel -     Hemoglobin A1c -     CBC -     TSH -     amLODipine (NORVASC) 10 MG tablet; Take 1 tablet (10 mg total) by mouth daily.  Smoker unmotivated to quit  At risk for acute ischemic cardiac event -     rosuvastatin (CRESTOR) 20 MG tablet; Take 1 tablet (20 mg total) by mouth daily.  Routine screening for STI (sexually transmitted infection) -     HIV antibody -     RPR -     GC/Chlamydia Probe Amp  Annual physical exam: Screening labs problem 1.  Advised that he would benefit from smoking cessation.  We will keep the cholesterol medication on for now until he can make some lifestyle changes that will equate to an improved ASCVD score.    The patient is advised to call or return to clinic if he does not see an improvement in symptoms, or to seek the care of the closest emergency department if he worsens with the above plan.   Deliah Boston, MHS, PA-C Primary Care at [facility] 01/08/2018 9:31 AM

## 2018-01-09 LAB — RENAL FUNCTION PANEL
Albumin: 4.5 g/dL (ref 3.5–5.5)
BUN/Creatinine Ratio: 11 (ref 9–20)
BUN: 11 mg/dL (ref 6–24)
CO2: 17 mmol/L — ABNORMAL LOW (ref 20–29)
CREATININE: 1.02 mg/dL (ref 0.76–1.27)
Calcium: 9.6 mg/dL (ref 8.7–10.2)
Chloride: 104 mmol/L (ref 96–106)
GFR calc Af Amer: 104 mL/min/{1.73_m2} (ref >59)
GFR, EST NON AFRICAN AMERICAN: 90 mL/min/{1.73_m2} (ref >59)
Glucose: 120 mg/dL — ABNORMAL HIGH (ref 65–99)
PHOSPHORUS: 2.7 mg/dL (ref 2.5–4.5)
Potassium: 4 mmol/L (ref 3.5–5.2)
SODIUM: 140 mmol/L (ref 134–144)

## 2018-01-09 LAB — CBC
HEMATOCRIT: 46.9 % (ref 37.5–51.0)
Hemoglobin: 15.6 g/dL (ref 13.0–17.7)
MCH: 29.5 pg (ref 26.6–33.0)
MCHC: 33.3 g/dL (ref 31.5–35.7)
MCV: 89 fL (ref 79–97)
Platelets: 302 10*3/uL (ref 150–379)
RBC: 5.28 x10E6/uL (ref 4.14–5.80)
RDW: 14.6 % (ref 12.3–15.4)
WBC: 7.9 10*3/uL (ref 3.4–10.8)

## 2018-01-09 LAB — HIV ANTIBODY (ROUTINE TESTING W REFLEX): HIV SCREEN 4TH GENERATION: NONREACTIVE

## 2018-01-09 LAB — HEMOGLOBIN A1C
ESTIMATED AVERAGE GLUCOSE: 137 mg/dL
Hgb A1c MFr Bld: 6.4 % — ABNORMAL HIGH (ref 4.8–5.6)

## 2018-01-09 LAB — TSH: TSH: 2.7 u[IU]/mL (ref 0.450–4.500)

## 2018-01-09 LAB — RPR: RPR Ser Ql: NONREACTIVE

## 2018-01-10 ENCOUNTER — Encounter: Payer: Self-pay | Admitting: Radiology

## 2018-01-10 LAB — GC/CHLAMYDIA PROBE AMP
Chlamydia trachomatis, NAA: NEGATIVE
Neisseria gonorrhoeae by PCR: NEGATIVE

## 2018-01-12 NOTE — Addendum Note (Signed)
Addended by: Baldwin CrownJOHNSON, SHAQUETTA D on: 01/12/2018 05:47 PM   Modules accepted: Orders

## 2018-01-13 LAB — LIPID PANEL
CHOL/HDL RATIO: 4.6 ratio (ref 0.0–5.0)
Cholesterol, Total: 187 mg/dL (ref 100–199)
HDL: 41 mg/dL (ref >39)
LDL CALC: 112 mg/dL — AB (ref 0–99)
Triglycerides: 170 mg/dL — ABNORMAL HIGH (ref 0–149)
VLDL Cholesterol Cal: 34 mg/dL (ref 5–40)

## 2018-03-15 ENCOUNTER — Encounter: Payer: Self-pay | Admitting: Family Medicine

## 2018-06-01 ENCOUNTER — Encounter: Payer: Self-pay | Admitting: Physician Assistant

## 2018-06-01 ENCOUNTER — Other Ambulatory Visit: Payer: Self-pay

## 2018-06-01 ENCOUNTER — Ambulatory Visit (INDEPENDENT_AMBULATORY_CARE_PROVIDER_SITE_OTHER): Payer: 59 | Admitting: Physician Assistant

## 2018-06-01 VITALS — BP 134/88 | HR 65 | Temp 98.0°F | Resp 16 | Ht 65.55 in | Wt 227.6 lb

## 2018-06-01 DIAGNOSIS — E7800 Pure hypercholesterolemia, unspecified: Secondary | ICD-10-CM | POA: Insufficient documentation

## 2018-06-01 DIAGNOSIS — J3489 Other specified disorders of nose and nasal sinuses: Secondary | ICD-10-CM | POA: Diagnosis not present

## 2018-06-01 MED ORDER — MOMETASONE FUROATE 50 MCG/ACT NA SUSP: 2.0000 | Freq: Every day | NASAL | 0 refills | Status: DC

## 2018-06-01 NOTE — Progress Notes (Signed)
Paul [name]  MRN: [id number] DOB: 1976/03/14  PCP: Georgina QuintSagardia, Miguel Jose, MD  Chief Complaint  Patient presents with  . Headache    with pressure in face  x3 weeks     Subjective:  Pt presents to clinic for sinus pressure that has worsens over the last week.  Most of the pressure is above his eyes.  He has had some intermittent sinus pressure for the last 3 weeks.  No ear pain.  No throat  Pain.  No recent increase in known stress. 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  He has no problems with his allergies.  He has used motrin and it helps.    History is obtained by patient.  Review of Systems  Constitutional: Negative for chills and fever.  HENT: Positive for sinus pressure and sinus pain. Negative for congestion, ear pain and sore throat.   Respiratory: Negative for cough.        No h/o asthma, smoker 1/4 ppd    Patient Active Problem List   Diagnosis Date Noted  . Elevated cholesterol 06/01/2018  . HTN (hypertension) 05/21/2012  . Nicotine addiction 05/21/2012    Current Outpatient Medications on File Prior to Visit  Medication Sig Dispense Refill  . amLODipine (NORVASC) 10 MG tablet Take 1 tablet (10 mg total) by mouth daily. 90 tablet 3  . rosuvastatin (CRESTOR) 20 MG tablet Take 1 tablet (20 mg total) by mouth daily. 90 tablet 3   No current facility-administered medications on file prior to visit.     No Known Allergies  Past Medical History:  Diagnosis Date  . Hypertension    Social History   Social History Narrative  . Not on file   Social History   Tobacco Use  . Smoking status: Current Every Day Smoker    Types: Cigarettes  . Smokeless tobacco: Never Used  Substance Use Topics  . Alcohol use: Yes    Alcohol/week: 4.0 standard drinks    Types: 4 Standard drinks or equivalent per week    Comment: socially  . Drug use: No   family history includes Breast cancer in his mother; Cancer in his father and mother;  Lung cancer in his mother.     Objective:  BP 134/88   Pulse 65   Temp 98 F (36.7 C) (Oral)   Resp 16   Ht 5' 5.55" (1.665 m)   Wt 227 lb 9.6 oz (103.2 kg)   SpO2 98%   BMI 37.24 kg/m  Body mass index is 37.24 kg/m.  Wt Readings from Last 3 Encounters:  06/01/18 227 lb 9.6 oz (103.2 kg)  01/08/18 228 lb 12.8 oz (103.8 kg)  11/29/17 231 lb (104.8 kg)    Physical Exam  Constitutional: He is oriented to person, place, and time. He appears well-developed and well-nourished.  HENT:  Head: Normocephalic and atraumatic.  Right Ear: Hearing, tympanic membrane, external ear and ear canal normal.  Left Ear: Hearing, tympanic membrane, external ear and ear canal normal.  Nose: Mucosal edema (red ) present. Right sinus exhibits no maxillary sinus tenderness and no frontal sinus tenderness. Left sinus exhibits no maxillary sinus tenderness and no frontal sinus tenderness.  Mouth/Throat: Uvula is midline, oropharynx is clear and moist and mucous membranes are normal.  Eyes: Conjunctivae are normal.  Neck: Normal range of motion.  Cardiovascular: Normal rate, regular rhythm and normal heart sounds.  Pulmonary/Chest: Effort normal and breath sounds normal. He has no  wheezes.  Lymphadenopathy:       Head (right side): No tonsillar, no preauricular, no posterior auricular and no occipital adenopathy present.       Head (left side): No tonsillar, no preauricular, no posterior auricular and no occipital adenopathy present.    He has cervical adenopathy.       Right cervical: No superficial cervical, no deep cervical and no posterior cervical adenopathy present.      Left cervical: Superficial cervical (slightly enlarged but nontender) adenopathy present. No deep cervical and no posterior cervical adenopathy present.       Right: No supraclavicular adenopathy present.       Left: No supraclavicular adenopathy present.  Neurological: He is alert and oriented to person, place, and time.  Skin:  Skin is warm and dry.  Psychiatric: Judgment normal.  Vitals reviewed.   Assessment and Plan :  Sinus pressure - Plan: mometasone (NASONEX) 50 MCG/ACT nasal spray -symptomatic care discussed with patient.  Suspect this is related to a mild cold but more likely related to to dusty work environments.  Will use Nasonex for 2 weeks to control symptoms okay if he continues to use Tylenol and ibuprofen.  He will follow-up if this does not improve his discomfort.  Patient verbalized to me that they understand the following: diagnosis, what is being done for them, what to expect and what should be done at home.  Their questions have been answered.  See after visit summary for patient specific instructions.  Benny Lennert PA-C  [facility] at [facility] 06/01/2018 11:58 AM  Please note: Portions of this report may have been transcribed using dragon voice recognition software. Every effort was made to ensure accuracy; however, inadvertent computerized transcription errors may be present.

## 2018-06-01 NOTE — Patient Instructions (Addendum)
  Increase water intake Use nose spray for 2 weeks and then if symptoms are better you can stop  Ok to use tylenol and motrin for the headaches    IF you received an x-ray today, you will receive an invoice from [facility]. Please contact [facility] at [phone] with questions or concerns regarding your invoice.   IF you received labwork today, you will receive an invoice from [facility]. Please contact [facility] at [phone] with questions or concerns regarding your invoice.   Our billing staff will not be able to assist you with questions regarding bills from these companies.  You will be contacted with the lab results as soon as they are available. The fastest way to get your results is to activate your My Chart account. Instructions are located on the last page of this paperwork. If you have not heard from us regarding the results in 2 weeks, please contact this office.

## 2018-07-01 ENCOUNTER — Other Ambulatory Visit: Payer: Self-pay | Admitting: Physician Assistant

## 2018-07-01 DIAGNOSIS — I10 Essential (primary) hypertension: Secondary | ICD-10-CM

## 2018-09-08 ENCOUNTER — Emergency Department (HOSPITAL_COMMUNITY)
Admission: EM | Admit: 2018-09-08 | Discharge: 2018-09-08 | Disposition: A | Discharge status: Home / Self Care | Payer: 59 | Attending: Emergency Medicine | Admitting: Emergency Medicine

## 2018-09-08 ENCOUNTER — Emergency Department (HOSPITAL_COMMUNITY): Payer: 59

## 2018-09-08 ENCOUNTER — Encounter (HOSPITAL_COMMUNITY): Payer: Self-pay | Admitting: Emergency Medicine

## 2018-09-08 ENCOUNTER — Other Ambulatory Visit: Payer: Self-pay

## 2018-09-08 DIAGNOSIS — K5792 Diverticulitis of intestine, part unspecified, without perforation or abscess without bleeding: Secondary | ICD-10-CM | POA: Diagnosis not present

## 2018-09-08 DIAGNOSIS — I10 Essential (primary) hypertension: Secondary | ICD-10-CM | POA: Insufficient documentation

## 2018-09-08 DIAGNOSIS — Z79899 Other long term (current) drug therapy: Secondary | ICD-10-CM | POA: Diagnosis not present

## 2018-09-08 DIAGNOSIS — R1030 Lower abdominal pain, unspecified: Secondary | ICD-10-CM | POA: Diagnosis present

## 2018-09-08 DIAGNOSIS — F17210 Nicotine dependence, cigarettes, uncomplicated: Secondary | ICD-10-CM | POA: Diagnosis not present

## 2018-09-08 LAB — COMPREHENSIVE METABOLIC PANEL
ALBUMIN: 4.2 g/dL (ref 3.5–5.0)
ALK PHOS: 79 U/L (ref 38–126)
ALT: 32 U/L (ref 0–44)
AST: 28 U/L (ref 15–41)
Anion gap: 9 (ref 5–15)
BILIRUBIN TOTAL: 1.1 mg/dL (ref 0.3–1.2)
BUN: 16 mg/dL (ref 6–20)
CALCIUM: 9.1 mg/dL (ref 8.9–10.3)
CO2: 23 mmol/L (ref 22–32)
Chloride: 105 mmol/L (ref 98–111)
Creatinine, Ser: 1.08 mg/dL (ref 0.61–1.24)
GFR calc Af Amer: >60 mL/min (ref >60)
GLUCOSE: 161 mg/dL — AB (ref 70–99)
Potassium: 4.2 mmol/L (ref 3.5–5.1)
Sodium: 137 mmol/L (ref 135–145)
TOTAL PROTEIN: 7.8 g/dL (ref 6.5–8.1)

## 2018-09-08 LAB — URINALYSIS, ROUTINE W REFLEX MICROSCOPIC
BACTERIA UA: NONE SEEN
Bilirubin Urine: NEGATIVE
Glucose, UA: NEGATIVE mg/dL
Ketones, ur: NEGATIVE mg/dL
LEUKOCYTES UA: NEGATIVE
Nitrite: NEGATIVE
PROTEIN: NEGATIVE mg/dL
Specific Gravity, Urine: 1.016 (ref 1.005–1.030)
pH: 6 (ref 5.0–8.0)

## 2018-09-08 LAB — CBC
HEMATOCRIT: 47.5 % (ref 39.0–52.0)
Hemoglobin: 15.1 g/dL (ref 13.0–17.0)
MCH: 28.4 pg (ref 26.0–34.0)
MCHC: 31.8 g/dL (ref 30.0–36.0)
MCV: 89.3 fL (ref 80.0–100.0)
NRBC: 0 % (ref 0.0–0.2)
Platelets: 281 10*3/uL (ref 150–400)
RBC: 5.32 MIL/uL (ref 4.22–5.81)
RDW: 14.6 % (ref 11.5–15.5)
WBC: 14.9 10*3/uL — AB (ref 4.0–10.5)

## 2018-09-08 LAB — LIPASE, BLOOD: Lipase: 24 U/L (ref 11–51)

## 2018-09-08 MED ORDER — AMOXICILLIN-POT CLAVULANATE 875-125 MG PO TABS: 1.0000 | ORAL_TABLET | Freq: Two times a day (BID) | ORAL | 0 refills | Status: DC

## 2018-09-08 MED ORDER — IOPAMIDOL (ISOVUE-300) INJECTION 61%
100.0000 mL | Freq: Once | INTRAVENOUS | Status: AC | PRN
  Administered 2018-09-08: 100 mL via INTRAVENOUS

## 2018-09-08 MED ORDER — AMOXICILLIN-POT CLAVULANATE 875-125 MG PO TABS
1.0000 | ORAL_TABLET | Freq: Once | ORAL | Status: AC
  Administered 2018-09-08: 1 via ORAL
  Filled 2018-09-08: qty 1

## 2018-09-08 MED ORDER — POLYETHYLENE GLYCOL 3350 17 G PO PACK: 17.0000 g | PACK | Freq: Every day | ORAL | 0 refills | Status: DC

## 2018-09-08 MED ORDER — OXYCODONE-ACETAMINOPHEN 5-325 MG PO TABS: 1.0000 | ORAL_TABLET | Freq: Three times a day (TID) | ORAL | 0 refills | Status: DC | PRN

## 2018-09-08 MED ORDER — FENTANYL CITRATE (PF) 100 MCG/2ML IJ SOLN
50.0000 ug | Freq: Once | INTRAMUSCULAR | Status: AC
  Administered 2018-09-08: 50 ug via INTRAVENOUS
  Filled 2018-09-08: qty 2

## 2018-09-08 MED ORDER — SODIUM CHLORIDE 0.9 % IV BOLUS
500.0000 mL | Freq: Once | INTRAVENOUS | Status: AC
  Administered 2018-09-08: 500 mL via INTRAVENOUS

## 2018-09-08 NOTE — ED Notes (Signed)
Patient transported to CT 

## 2018-09-08 NOTE — ED Triage Notes (Signed)
Patient complaining of lower abdominal pain radiating into groin area starting at 1200 today.

## 2018-09-08 NOTE — ED Provider Notes (Signed)
[facility] EMERGENCY DEPARTMENT Provider Note   CSN: [id number] Arrival date & time: 09/08/18  1832     History   Chief Complaint Chief Complaint  Patient presents with  . Abdominal Pain    HPI Paul [name] is a 42 y.o. male.  HPI Patient presents with lower abdominal pain.  States it also burns when he urinates.  States he has some difficulty urinating and some difficulty having a bowel movement.  Has had mild chills but no fever.  Started earlier today.  States he feels bad all over.  Has not had pains like this before.  Has had nausea without vomiting.  No testicular pain. Past Medical History:  Diagnosis Date  . Hypertension     Patient Active Problem List   Diagnosis Date Noted  . Elevated cholesterol 06/01/2018  . HTN (hypertension) 05/21/2012  . Nicotine addiction 05/21/2012    Past Surgical History:  Procedure Laterality Date  . FRACTURE SURGERY    . MANDIBLE SURGERY          Home Medications    Prior to Admission medications   Medication Sig Start Date End Date Taking? Authorizing Provider  amLODipine (NORVASC) 10 MG tablet Take 1 tablet (10 mg total) by mouth daily. 01/08/18  Yes Ofilia Neaslark, Michael L, PA-C  Omeprazole Magnesium (PRILOSEC OTC PO) Take 1 capsule by mouth daily as needed (stomach problems).   Yes [provider]  amoxicillin-clavulanate (AUGMENTIN) 875-125 MG tablet Take 1 tablet by mouth every 12 (twelve) hours. 09/08/18   Benjiman CorePickering, Malay Fantroy, MD  mometasone (NASONEX) 50 MCG/ACT nasal spray Place 2 sprays into the nose daily. 06/01/18   Weber, Dema SeverinSarah L, PA-C  oxyCODONE-acetaminophen (PERCOCET/ROXICET) 5-325 MG tablet Take 1-2 tablets by mouth every 8 (eight) hours as needed for severe pain. 09/08/18   Benjiman CorePickering, Zania Kalisz, MD  polyethylene glycol [facility](MIRALAX) packet Take 17 g by mouth daily. 09/08/18   Benjiman CorePickering, Kelce Bouton, MD  rosuvastatin (CRESTOR) 20 MG tablet Take 1 tablet (20 mg total) by mouth daily. 01/08/18   Ofilia Neaslark, Michael L, PA-C     Family History Family History  Problem Relation Age of Onset  . Lung cancer Mother   . Breast cancer Mother   . Cancer Mother   . Cancer Father     Social History Social History   Tobacco Use  . Smoking status: Current Every Day Smoker    Types: Cigarettes  . Smokeless tobacco: Never Used  Substance Use Topics  . Alcohol use: Yes    Alcohol/week: 4.0 standard drinks    Types: 4 Standard drinks or equivalent per week    Comment: socially  . Drug use: No     Allergies   Patient has no known allergies.   Review of Systems Review of Systems  Constitutional: Positive for appetite change.  HENT: Negative for congestion.   Respiratory: Negative for shortness of breath.   Cardiovascular: Negative for chest pain.  Gastrointestinal: Positive for abdominal pain and nausea.  Genitourinary: Negative for hematuria and testicular pain.  Musculoskeletal: Negative for back pain.  Skin: Negative for pallor.  Neurological: Negative for weakness.  Hematological: Negative for adenopathy.  Psychiatric/Behavioral: Negative for confusion.     Physical Exam Updated Vital Signs BP 136/84   Pulse 74   Temp 98.7 F (37.1 C) (Oral)   Resp 16   Ht 5\' 6"  (1.676 m)   Wt 99.8 kg   SpO2 95%   BMI 35.51 kg/m   Physical Exam  Constitutional: He appears well-developed.  HENT:  Head: Atraumatic.  Cardiovascular: Normal rate.  Abdominal: Normal appearance.  Suprapubic tenderness.  Also tenderness to the right lower quadrant.  Genitourinary: Penis normal.  Neurological: He is alert.  Skin: Skin is warm. Capillary refill takes less than 2 seconds.     ED Treatments / Results  Labs (all labs ordered are listed, but only abnormal results are displayed) Labs Reviewed  COMPREHENSIVE METABOLIC PANEL - Abnormal; Notable for the following components:      Result Value   Glucose, Bld 161 (*)    All other components within normal limits  CBC - Abnormal; Notable for the following  components:   WBC 14.9 (*)    All other components within normal limits  URINALYSIS, ROUTINE W REFLEX MICROSCOPIC - Abnormal; Notable for the following components:   Hgb urine dipstick SMALL (*)    All other components within normal limits  LIPASE, BLOOD    EKG None  Radiology Ct Abdomen Pelvis W Contrast  Result Date: 09/08/2018 CLINICAL DATA:  Lower abdominal pain. EXAM: CT ABDOMEN AND PELVIS WITH CONTRAST TECHNIQUE: Multidetector CT imaging of the abdomen and pelvis was performed using the standard protocol following bolus administration of intravenous contrast. CONTRAST:  ISOVUE-300 IOPAMIDOL (ISOVUE-300) INJECTION 61% COMPARISON:  None. FINDINGS: Lower chest: Lung bases are clear. No effusions. Heart is normal size. Hepatobiliary: No focal hepatic abnormality. Gallbladder unremarkable. Pancreas: No focal abnormality or ductal dilatation. Spleen: No focal abnormality.  Normal size. Adrenals/Urinary Tract: No adrenal abnormality. No focal renal abnormality. No stones or hydronephrosis. Urinary bladder is decompressed with Foley catheter in place. Stomach/Bowel: Normal appendix. There is inflammatory stranding around the mid sigmoid colon with sigmoid diverticulosis. Findings compatible with active diverticulitis. No evidence of bowel obstruction. Vascular/Lymphatic: No evidence of aneurysm or adenopathy. Reproductive: No visible focal abnormality. Other: No free fluid or free air. Musculoskeletal: No acute bony abnormality. IMPRESSION: Sigmoid diverticulosis with inflammatory stranding around the sigmoid colon compatible with active diverticulitis. On Electronically Signed   By: Charlett Nose M.D.   On: 09/08/2018 20:36    Procedures Procedures (including critical care time)  Medications Ordered in ED Medications  amoxicillin-clavulanate (AUGMENTIN) 875-125 MG per tablet 1 tablet (has no administration in time range)  sodium chloride 0.9 % bolus 500 mL (0 mLs Intravenous Stopped  09/08/18 2103)  fentaNYL (SUBLIMAZE) injection 50 mcg (50 mcg Intravenous Given 09/08/18 1928)  iopamidol (ISOVUE-300) 61 % injection 100 mL (100 mLs Intravenous Contrast Given 09/08/18 2019)     Initial Impression / Assessment and Plan / ED Course  I have reviewed the triage vital signs and the nursing notes.  Pertinent labs & imaging results that were available during my care of the patient were reviewed by me and considered in my medical decision making (see chart for details).     Patient with suprapubic/lower abdominal pain.  Some urinary symptoms.  Patient was not able to urinate and still had fair amount of urine in the bladder.  Foley catheter placed with couple 100 cc out.  Felt somewhat better but CT scan done due to elevated white count and did have diverticulitis.  Likely this area due to irritation of the bladder from the diverticulitis.  Overall well-appearing as tolerated orals.  Will discharge with symptomatic treatment.  Follow-up with PCP as needed.  Final Clinical Impressions(s) / ED Diagnoses   Final diagnoses:  Diverticulitis    ED Discharge Orders         Ordered    amoxicillin-clavulanate (AUGMENTIN) 875-125 MG  tablet  Every 12 hours     09/08/18 2103    oxyCODONE-acetaminophen (PERCOCET/ROXICET) 5-325 MG tablet  Every 8 hours PRN     09/08/18 2103    polyethylene glycol (MIRALAX) packet  Daily     09/08/18 2104           Benjiman Core, MD 09/08/18 2115

## 2018-09-10 ENCOUNTER — Other Ambulatory Visit: Payer: Self-pay

## 2018-09-10 ENCOUNTER — Ambulatory Visit (INDEPENDENT_AMBULATORY_CARE_PROVIDER_SITE_OTHER): Payer: 59 | Admitting: Emergency Medicine

## 2018-09-10 ENCOUNTER — Encounter: Payer: Self-pay | Admitting: Emergency Medicine

## 2018-09-10 VITALS — BP 116/73 | HR 79 | Temp 100.2°F | Resp 18 | Ht 65.0 in | Wt 223.0 lb

## 2018-09-10 DIAGNOSIS — K5792 Diverticulitis of intestine, part unspecified, without perforation or abscess without bleeding: Secondary | ICD-10-CM

## 2018-09-10 LAB — POCT CBC
GRANULOCYTE PERCENT: 82.8 % — AB (ref 37–80)
HCT, POC: 41.7 % — AB (ref 29–41)
Hemoglobin: 13.7 g/dL — AB (ref 9.5–13.5)
Lymph, poc: 1.9 (ref 0.6–3.4)
MCH, POC: 29.6 pg (ref 27–31.2)
MCHC: 33 g/dL (ref 31.8–35.4)
MCV: 89.8 fL (ref 76–111)
MID (CBC): 1.2 — AB (ref 0–0.9)
MPV: 7.1 fL (ref 0–99.8)
POC GRANULOCYTE: 14.9 — AB (ref 2–6.9)
POC LYMPH PERCENT: 10.6 %L (ref 10–50)
POC MID %: 6.6 % (ref 0–12)
Platelet Count, POC: 250 10*3/uL (ref 142–424)
RBC: 4.64 M/uL — AB (ref 4.69–6.13)
RDW, POC: 14.7 %
WBC: 18 10*3/uL — AB (ref 4.6–10.2)

## 2018-09-10 MED ORDER — METRONIDAZOLE 500 MG PO TABS: 500.0000 mg | ORAL_TABLET | Freq: Three times a day (TID) | ORAL | 0 refills | Status: DC

## 2018-09-10 MED ORDER — CIPROFLOXACIN HCL 500 MG PO TABS: 500.0000 mg | ORAL_TABLET | Freq: Two times a day (BID) | ORAL | 0 refills | Status: AC

## 2018-09-10 MED FILL — Oxycodone w/ Acetaminophen Tab 5-325 MG: ORAL | Qty: 6 | Status: AC

## 2018-09-10 NOTE — Progress Notes (Signed)
Paul [name] 42 y.o.   Chief Complaint  Patient presents with  . Diverticulitis    09/08/2018 at ED Paul HawkingAnnie [name] - per patient needs stronger pain medication    HISTORY OF PRESENT ILLNESS: This is a 42 y.o. male complaining of left lower abdominal pain for 3 days.  Went to the emergency room on 09/08/2018 and diagnosed acute diverticulitis confirmed with abdominal CT scan.  Started on Augmentin and Percocet for pain.  No significant clinical improvement.  Patient having a hard time moving his bowels.  States it hurts to pass gas.  Able to urinate better than 2 days ago.  Denies nausea or vomiting.  Less appetite.  Denies high fever or chills.  Overall does not feel better.  Lab Results  Component Value Date   WBC 14.9 (H) 09/08/2018   HGB 15.1 09/08/2018   HCT 47.5 09/08/2018   MCV 89.3 09/08/2018   PLT 281 09/08/2018    CT ABDOMEN AND PELVIS WITH CONTRAST  TECHNIQUE: Multidetector CT imaging of the abdomen and pelvis was performed using the standard protocol following bolus administration of intravenous contrast.  CONTRAST:  100mL ISOVUE-300 IOPAMIDOL (ISOVUE-300) INJECTION 61%  COMPARISON:  None.  FINDINGS: Lower chest: Lung bases are clear. No effusions. Heart is normal size.  Hepatobiliary: No focal hepatic abnormality. Gallbladder unremarkable.  Pancreas: No focal abnormality or ductal dilatation.  Spleen: No focal abnormality.  Normal size.  Adrenals/Urinary Tract: No adrenal abnormality. No focal renal abnormality. No stones or hydronephrosis. Urinary bladder is decompressed with Foley catheter in place.  Stomach/Bowel: Normal appendix. There is inflammatory stranding around the mid sigmoid colon with sigmoid diverticulosis. Findings compatible with active diverticulitis. No evidence of bowel obstruction.  Vascular/Lymphatic: No evidence of aneurysm or adenopathy.  Reproductive: No visible focal abnormality.  Other: No free fluid or free  air.  Musculoskeletal: No acute bony abnormality.  IMPRESSION: Sigmoid diverticulosis with inflammatory stranding around the sigmoid colon compatible with active diverticulitis. On   Electronically Signed   By: Charlett NoseKevin  Dover M.D.   On: 09/08/2018 20:36  HPI   Prior to Admission medications   Medication Sig Start Date End Date Taking? Authorizing Provider  amLODipine (NORVASC) 10 MG tablet Take 1 tablet (10 mg total) by mouth daily. 01/08/18  Yes Ofilia Neaslark, Michael L, PA-C  amoxicillin-clavulanate (AUGMENTIN) 875-125 MG tablet Take 1 tablet by mouth every 12 (twelve) hours. 09/08/18  Yes Benjiman CorePickering, Nathan, MD  oxyCODONE-acetaminophen (PERCOCET/ROXICET) 5-325 MG tablet Take 1-2 tablets by mouth every 8 (eight) hours as needed for severe pain. 09/08/18  Yes Benjiman CorePickering, Nathan, MD  polyethylene glycol [facility](MIRALAX) packet Take 17 g by mouth daily. 09/08/18  Yes Benjiman CorePickering, Nathan, MD  mometasone (NASONEX) 50 MCG/ACT nasal spray Place 2 sprays into the nose daily. Patient not taking: Reported on 09/10/2018 06/01/18   Morrell RiddleWeber, Sarah L, PA-C  Omeprazole Magnesium (PRILOSEC OTC PO) Take 1 capsule by mouth daily as needed (stomach problems).    [provider]  rosuvastatin (CRESTOR) 20 MG tablet Take 1 tablet (20 mg total) by mouth daily. Patient not taking: Reported on 09/10/2018 01/08/18   Ofilia Neaslark, Michael L, PA-C    No Known Allergies  Patient Active Problem List   Diagnosis Date Noted  . Elevated cholesterol 06/01/2018  . HTN (hypertension) 05/21/2012  . Nicotine addiction 05/21/2012    Past Medical History:  Diagnosis Date  . Hypertension     Past Surgical History:  Procedure Laterality Date  . FRACTURE SURGERY    . MANDIBLE SURGERY  Social History   Socioeconomic History  . Marital status: Married    Spouse name: Not on file  . Number of children: Not on file  . Years of education: Not on file  . Highest education level: Not on file  Occupational History  . Not  on file  Social Needs  . Financial resource strain: Not on file  . Food insecurity:    Worry: Not on file    Inability: Not on file  . Transportation needs:    Medical: Not on file    Non-medical: Not on file  Tobacco Use  . Smoking status: Current Every Day Smoker    Types: Cigarettes  . Smokeless tobacco: Never Used  Substance and Sexual Activity  . Alcohol use: Yes    Alcohol/week: 4.0 standard drinks    Types: 4 Standard drinks or equivalent per week    Comment: socially  . Drug use: No  . Sexual activity: Yes  Lifestyle  . Physical activity:    Days per week: Not on file    Minutes per session: Not on file  . Stress: Not on file  Relationships  . Social connections:    Talks on phone: Not on file    Gets together: Not on file    Attends religious service: Not on file    Active member of club or organization: Not on file    Attends meetings of clubs or organizations: Not on file    Relationship status: Not on file  . Intimate partner violence:    Fear of current or ex partner: Not on file    Emotionally abused: Not on file    Physically abused: Not on file    Forced sexual activity: Not on file  Other Topics Concern  . Not on file  Social History Narrative  . Not on file    Family History  Problem Relation Age of Onset  . Lung cancer Mother   . Breast cancer Mother   . Cancer Mother   . Cancer Father      Review of Systems  Gastrointestinal: Positive for abdominal pain.  All other systems reviewed and are negative.   Vitals:   09/10/18 1049  BP: 116/73  Pulse: 79  Resp: 18  Temp: 100.2 F (37.9 C)  SpO2: 97%    Physical Exam  Constitutional: He is oriented to person, place, and time. He appears well-developed and well-nourished.  HENT:  Head: Normocephalic and atraumatic.  Nose: Nose normal.  Mouth/Throat: Oropharynx is clear and moist.  Eyes: Pupils are equal, round, and reactive to light. Conjunctivae and EOM are normal.  Neck: Normal  range of motion. Neck supple.  Cardiovascular: Normal rate, regular rhythm and normal heart sounds.  Pulmonary/Chest: Effort normal and breath sounds normal.  Abdominal: Normal appearance and bowel sounds are normal. He exhibits no distension and no pulsatile midline mass. There is no hepatosplenomegaly. There is tenderness in the left lower quadrant. There is no rigidity, no rebound and no guarding.  Musculoskeletal: Normal range of motion.  Neurological: He is alert and oriented to person, place, and time. No sensory deficit. He exhibits normal muscle tone.  Skin: Skin is warm and dry.  Psychiatric: He has a normal mood and affect. His behavior is normal.  Vitals reviewed.  Results for orders placed or performed in visit on 09/10/18 (from the past 24 hour(s))  POCT CBC     Status: Abnormal   Collection Time: 09/10/18 11:45 AM  Result Value  Ref Range   WBC 18.0 (A) 4.6 - 10.2 K/uL   Lymph, poc 1.9 0.6 - 3.4   POC LYMPH PERCENT 10.6 10 - 50 %L   MID (cbc) 1.2 (A) 0 - 0.9   POC MID % 6.6 0 - 12 %M   POC Granulocyte 14.9 (A) 2 - 6.9   Granulocyte percent 82.8 (A) 37 - 80 %G   RBC 4.64 (A) 4.69 - 6.13 M/uL   Hemoglobin 13.7 (A) 9.5 - 13.5 g/dL   HCT, POC 16.1 (A) 29 - 41 %   MCV 89.8 76 - 111 fL   MCH, POC 29.6 27 - 31.2 pg   MCHC 33.0 31.8 - 35.4 g/dL   RDW, POC 09.6 %   Platelet Count, POC 250 142 - 424 K/uL   MPV 7.1 0 - 99.8 fL    A total of 40 minutes was spent in the room with the patient, greater than 50% of which was in counseling/coordination of care regarding diagnosis, change in treatment, lab results, management, diet, and need to go to the emergency room if no better or worse in the next 48 hours.  Otherwise follow-up with me next Friday..  ASSESSMENT & PLAN: Audric was seen today for diverticulitis.  Diagnoses and all orders for this visit:  Acute diverticulitis -     Ambulatory referral to Gastroenterology -     POCT CBC -     ciprofloxacin (CIPRO) 500 MG tablet;  Take 1 tablet (500 mg total) by mouth 2 (two) times daily for 7 days. -     metroNIDAZOLE (FLAGYL) 500 MG tablet; Take 1 tablet (500 mg total) by mouth 3 (three) times daily.    Patient Instructions      Stop oxycodone.  Only take Tylenol every 4-6 hours as needed for pain.  Stay on clear liquid diet for 24 to 48 hours. Stop Augmentin and start Cipro and Flagyl as prescribed. Must return to the emergency room if no better or worse in the next 48 hours. If improved follow-up here next Friday.  Diverticulitis Diverticulitis is when small pockets in your large intestine (colon) get infected or swollen. This causes stomach pain and watery poop (diarrhea). These pouches are called diverticula. They form in people who have a condition called diverticulosis. Follow these instructions at home: Medicines  Take over-the-counter and prescription medicines only as told by your doctor. These include: ? Antibiotics. ? Pain medicines. ? Fiber pills. ? Probiotics. ? Stool softeners.  Do not drive or use heavy machinery while taking prescription pain medicine.  If you were prescribed an antibiotic, take it as told. Do not stop taking it even if you feel better. General instructions  Follow a diet as told by your doctor.  When you feel better, your doctor may tell you to change your diet. You may need to eat a lot of fiber. Fiber makes it easier to poop (have bowel movements). Healthy foods with fiber include: ? Berries. ? Beans. ? Lentils. ? Green vegetables.  Exercise 3 or more times a week. Aim for 30 minutes each time. Exercise enough to sweat and make your heart beat faster.  Keep all follow-up visits as told. This is important. You may need to have an exam of the large intestine. This is called a colonoscopy. Contact a doctor if:  Your pain does not get better.  You have a hard time eating or drinking.  You are not pooping like normal. Get help right away if:  Your pain gets  worse.  Your problems do not get better.  Your problems get worse very fast.  You have a fever.  You throw up (vomit) more than one time.  You have poop that is: ? Bloody. ? Black. ? Tarry. Summary  Diverticulitis is when small pockets in your large intestine (colon) get infected or swollen.  Take medicines only as told by your doctor.  Follow a diet as told by your doctor. This information is not intended to replace advice given to you by your health care provider. Make sure you discuss any questions you have with your health care provider. Document Released: 03/28/2008 Document Revised: 10/27/2016 Document Reviewed: 10/27/2016 Elsevier Interactive Patient Education  2017 [employer].   If you have lab work done today you will be contacted with your lab results within the next 2 weeks.  If you have not heard from [place] then please contact us. The fastest way to get your results is to register for My Chart.   IF you received an x-ray today, you will receive an invoice from [facility]. Please contact [facility] at [phone] with questions or concerns regarding your invoice.   IF you received labwork today, you will receive an invoice from [facility]. Please contact [facility] at [phone] with questions or concerns regarding your invoice.   Our billing staff will not be able to assist you with questions regarding bills from these companies.  You will be contacted with the lab results as soon as they are available. The fastest way to get your results is to activate your My Chart account. Instructions are located on the last page of this paperwork. If you have not heard from [place] regarding the results in 2 weeks, please contact this office.         Edwina Barth, MD [facility]

## 2018-09-10 NOTE — Patient Instructions (Addendum)
Stop oxycodone.  Only take Tylenol every 4-6 hours as needed for pain.  Stay on clear liquid diet for 24 to 48 hours. Stop Augmentin and start Cipro and Flagyl as prescribed. Must return to the emergency room if no better or worse in the next 48 hours. If improved follow-up here next Friday.  Diverticulitis Diverticulitis is when small pockets in your large intestine (colon) get infected or swollen. This causes stomach pain and watery poop (diarrhea). These pouches are called diverticula. They form in people who have a condition called diverticulosis. Follow these instructions at home: Medicines  Take over-the-counter and prescription medicines only as told by your doctor. These include: ? Antibiotics. ? Pain medicines. ? Fiber pills. ? Probiotics. ? Stool softeners.  Do not drive or use heavy machinery while taking prescription pain medicine.  If you were prescribed an antibiotic, take it as told. Do not stop taking it even if you feel better. General instructions  Follow a diet as told by your doctor.  When you feel better, your doctor may tell you to change your diet. You may need to eat a lot of fiber. Fiber makes it easier to poop (have bowel movements). Healthy foods with fiber include: ? Berries. ? Beans. ? Lentils. ? Green vegetables.  Exercise 3 or more times a week. Aim for 30 minutes each time. Exercise enough to sweat and make your heart beat faster.  Keep all follow-up visits as told. This is important. You may need to have an exam of the large intestine. This is called a colonoscopy. Contact a doctor if:  Your pain does not get better.  You have a hard time eating or drinking.  You are not pooping like normal. Get help right away if:  Your pain gets worse.  Your problems do not get better.  Your problems get worse very fast.  You have a fever.  You throw up (vomit) more than one time.  You have poop that  is: ? Bloody. ? Black. ? Tarry. Summary  Diverticulitis is when small pockets in your large intestine (colon) get infected or swollen.  Take medicines only as told by your doctor.  Follow a diet as told by your doctor. This information is not intended to replace advice given to you by your health care provider. Make sure you discuss any questions you have with your health care provider. Document Released: 03/28/2008 Document Revised: 10/27/2016 Document Reviewed: 10/27/2016 Elsevier Interactive Patient Education  2017 [employer]   If you have lab work done today you will be contacted with your lab results within the next 2 weeks.  If you have not heard from us then please contact us. The fastest way to get your results is to register for My Chart.   IF you received an x-ray today, you will receive an invoice from [facility]. Please contact [facility] at [phone] with questions or concerns regarding your invoice.   IF you received labwork today, you will receive an invoice from [facility]. Please contact [facility] at [phone] with questions or concerns regarding your invoice.   Our billing staff will not be able to assist you with questions regarding bills from these companies.  You will be contacted with the lab results as soon as they are available. The fastest way to get your results is to activate your My Chart account. Instructions are located on the last page of this paperwork. If you have not heard from us regarding the results in 2  weeks, please contact this office.

## 2018-09-11 ENCOUNTER — Encounter

## 2018-09-11 ENCOUNTER — Encounter: Payer: Self-pay | Admitting: Internal Medicine

## 2018-09-11 ENCOUNTER — Ambulatory Visit: Payer: 59 | Admitting: Physician Assistant

## 2018-09-12 ENCOUNTER — Encounter: Payer: Self-pay | Admitting: Gastroenterology

## 2018-09-14 ENCOUNTER — Ambulatory Visit (INDEPENDENT_AMBULATORY_CARE_PROVIDER_SITE_OTHER): Payer: 59 | Admitting: Emergency Medicine

## 2018-09-14 ENCOUNTER — Ambulatory Visit (INDEPENDENT_AMBULATORY_CARE_PROVIDER_SITE_OTHER): Payer: 59 | Admitting: Gastroenterology

## 2018-09-14 ENCOUNTER — Encounter: Payer: Self-pay | Admitting: Emergency Medicine

## 2018-09-14 ENCOUNTER — Encounter: Payer: Self-pay | Admitting: Gastroenterology

## 2018-09-14 ENCOUNTER — Other Ambulatory Visit: Payer: Self-pay

## 2018-09-14 VITALS — BP 117/77 | HR 67 | Temp 98.9°F | Resp 16 | Ht 66.0 in | Wt 222.2 lb

## 2018-09-14 VITALS — BP 132/68 | HR 72 | Ht 66.0 in | Wt 221.0 lb

## 2018-09-14 DIAGNOSIS — K5900 Constipation, unspecified: Secondary | ICD-10-CM | POA: Diagnosis not present

## 2018-09-14 DIAGNOSIS — K5732 Diverticulitis of large intestine without perforation or abscess without bleeding: Secondary | ICD-10-CM | POA: Diagnosis not present

## 2018-09-14 DIAGNOSIS — K5792 Diverticulitis of intestine, part unspecified, without perforation or abscess without bleeding: Secondary | ICD-10-CM | POA: Diagnosis not present

## 2018-09-14 MED ORDER — SOD PICOSULFATE-MAG OX-CIT ACD 10-3.5-12 MG-GM -GM/160ML PO SOLN: 1.0000 | ORAL | 0 refills | Status: DC

## 2018-09-14 NOTE — Patient Instructions (Addendum)
     If you have lab work done today you will be contacted with your lab results within the next 2 weeks.  If you have not heard from us then please contact us. The fastest way to get your results is to register for My Chart.   IF you received an x-ray today, you will receive an invoice from [facility]. Please contact [facility] at [phone] with questions or concerns regarding your invoice.   IF you received labwork today, you will receive an invoice from [facility]. Please contact [facility] at [phone] with questions or concerns regarding your invoice.   Our billing staff will not be able to assist you with questions regarding bills from these companies.  You will be contacted with the lab results as soon as they are available. The fastest way to get your results is to activate your My Chart account. Instructions are located on the last page of this paperwork. If you have not heard from us regarding the results in 2 weeks, please contact this office.      Diverticulitis Diverticulitis is when small pockets in your large intestine (colon) get infected or swollen. This causes stomach pain and watery poop (diarrhea). These pouches are called diverticula. They form in people who have a condition called diverticulosis. Follow these instructions at home: Medicines  Take over-the-counter and prescription medicines only as told by your doctor. These include: ? Antibiotics. ? Pain medicines. ? Fiber pills. ? Probiotics. ? Stool softeners.  Do not drive or use heavy machinery while taking prescription pain medicine.  If you were prescribed an antibiotic, take it as told. Do not stop taking it even if you feel better. General instructions  Follow a diet as told by your doctor.  When you feel better, your doctor may tell you to change your diet. You may need to eat a lot of fiber. Fiber makes it easier to poop (have bowel movements). Healthy foods with fiber  include: ? Berries. ? Beans. ? Lentils. ? Green vegetables.  Exercise 3 or more times a week. Aim for 30 minutes each time. Exercise enough to sweat and make your heart beat faster.  Keep all follow-up visits as told. This is important. You may need to have an exam of the large intestine. This is called a colonoscopy. Contact a doctor if:  Your pain does not get better.  You have a hard time eating or drinking.  You are not pooping like normal. Get help right away if:  Your pain gets worse.  Your problems do not get better.  Your problems get worse very fast.  You have a fever.  You throw up (vomit) more than one time.  You have poop that is: ? Bloody. ? Black. ? Tarry. Summary  Diverticulitis is when small pockets in your large intestine (colon) get infected or swollen.  Take medicines only as told by your doctor.  Follow a diet as told by your doctor. This information is not intended to replace advice given to you by your health care provider. Make sure you discuss any questions you have with your health care provider. Document Released: 03/28/2008 Document Revised: 10/27/2016 Document Reviewed: 10/27/2016 Elsevier Interactive Patient Education  2017 [employer]  

## 2018-09-14 NOTE — Progress Notes (Signed)
Chief Complaint: Recent acute diverticulitis   Referring Provider:     Georgina Quint, MD    HPI:     Paul [name] is a 42 y.o. male referred to the [facility] for evaluation of recent acute diverticulitis.  He presented to the emergency department on 09/08/2018 with progressive LLQ pain, building over a couple of days. Had associated subjective fever and night sweats.  No hematochezia or melena.  No prior similar sxs.   ER evaluation notable for WBC 14.9 otherwise normal CBC, normal CMP, normal lipase.  CT demonstrated sigmoid diverticulosis with inflammatory stranding on the sigmoid colon patible with acute diverticulitis.  He was treated with IV fluids, fentanyl and discharged with Augmentin and Percocet and MiraLAX.  He followed up with his PCM on 09/10/2018, reporting no significant clinical improvement at that time along with constipation.  Stopped oxycodone, changed to Tylenol as needed, and stopped Augmentin and was changed to Cipro and Flagyl x7 days and referred to GI for further evaluation.  Today he states sxs have improved since seeing his PCM earlier this week. Does describe intense rectal pain with BM, which has improved over the week since starting Miralax. Has never had these sxs before either. Last BM was this AM, which was small volume, but pain less intense. Still with straining and pushing to have BM, which he thinks "is because my body anticipates the pain and doesn't let me have a BM". No issues with urination. No longer with fever or night sweats.   No known family history of CRC, GI malignancy, liver disease, pancreatic disease, or IBD.  No prior colonoscopy or EGD.  No preceding changes in medications or new herbal supplements, OTCs.  Past Medical History:  Diagnosis Date  . Hypertension      Past Surgical History:  Procedure Laterality Date  . FRACTURE SURGERY    . MANDIBLE SURGERY     Family History  Problem Relation  Age of Onset  . Lung cancer Mother   . Breast cancer Mother   . Cancer Mother   . Cancer Father   . Colon cancer Neg Hx    Social History   Tobacco Use  . Smoking status: Current Every Day Smoker    Types: Cigarettes  . Smokeless tobacco: Never Used  Substance Use Topics  . Alcohol use: Yes    Alcohol/week: 4.0 standard drinks    Types: 4 Standard drinks or equivalent per week    Comment: socially  . Drug use: No   Current Outpatient Medications  Medication Sig Dispense Refill  . amLODipine (NORVASC) 10 MG tablet Take 1 tablet (10 mg total) by mouth daily. 90 tablet 3  . ciprofloxacin (CIPRO) 500 MG tablet Take 1 tablet (500 mg total) by mouth 2 (two) times daily for 7 days. 14 tablet 0  . metroNIDAZOLE (FLAGYL) 500 MG tablet Take 1 tablet (500 mg total) by mouth 3 (three) times daily. 21 tablet 0  . mometasone (NASONEX) 50 MCG/ACT nasal spray Place 2 sprays into the nose daily. 17 g 0  . Omeprazole Magnesium (PRILOSEC OTC PO) Take 1 capsule by mouth daily as needed (stomach problems).    . polyethylene glycol (MIRALAX) packet Take 17 g by mouth daily. 14 each 0  . rosuvastatin (CRESTOR) 20 MG tablet Take 1 tablet (20 mg total) by mouth daily. 90 tablet 3   No current facility-administered medications for this visit.  No Known Allergies   Review of Systems: All systems reviewed and negative except where noted in HPI.     Physical Exam:    Wt Readings from Last 3 Encounters:  09/14/18 221 lb (100.2 kg)  09/10/18 223 lb (101.2 kg)  09/08/18 220 lb (99.8 kg)    BP 132/68   Pulse 72   Ht 5\' 6"  (1.676 m)   Wt 221 lb (100.2 kg)   BMI 35.67 kg/m  Constitutional:  Pleasant, in no acute distress. Psychiatric: Normal mood and affect. Behavior is normal. EENT: Pupils normal.  Conjunctivae are normal. No scleral icterus. Neck supple. No cervical LAD. Cardiovascular: Normal rate, regular rhythm. No edema Pulmonary/chest: Effort normal and breath sounds normal. No  wheezing, rales or rhonchi. Abdominal: Soft, nondistended, nontender. Bowel sounds active throughout. There are no masses palpable. No hepatomegaly. Neurological: Alert and oriented to person place and time. Skin: Skin is warm and dry. No rashes noted.   ASSESSMENT AND PLAN;   Paul [name] is a 42 y.o. male presenting with:  1) Diverticulitis: Recent clinical and radiographic evidence of acute diverticulitis, initially treated with Augmentin, then changed to ciprofloxacin/Flagyl earlier this week with clinical improvement since antimicrobial change.  Discussed the pathophysiology of diverticulosis/diverticulitis at length and will proceed as below:  - Continue Cipro/Flagyl as prescribed until complete - Colonoscopy in 8 weeks to evaluate for mucosal healing and rule out provoking factor (i.e. polyp, mass, etc.)  2) Constipation: Acute change in bowel habits surrounding recent acute diverticulitis.  Clinical presentation suspicious for pelvic floor dyssynergy.  Described this diagnosis at length, and given the acute onset of the symptoms suspect he should do well reverting back to normal bowel habits with changes as below:  - Resume MiraLAX and titrate to soft, regular stools without straining to have BM. -States that his pain has overall improved over the course of the week.  Once he is maintaining regular, soft stools, can decrease then completely stop MiraLAX. -If symptoms do not completely resolve despite resolution of other GI issues, can consider further evaluation with ARM with possible referral for biofeedback.  Again, given the acute onset in symptoms, patient is generally doing well without need for pelvic floor retraining.  We will continue to monitor  The indications, risks, and benefits of colonoscopy were explained to the patient in detail. Risks include but are not limited to bleeding, perforation, adverse reaction to medications, and cardiopulmonary compromise. Sequelae  include but are not limited to the possibility of surgery, hospitalization, and mortality. The patient verbalized understanding and wished to proceed. All questions answered, referred to the scheduler and bowel prep ordered. Further recommendations pending results of the exam.     Shellia CleverlyVito V Hillary Schwegler, DO, FACG  09/14/2018, 9:03 AM   [place], [place], *

## 2018-09-14 NOTE — Progress Notes (Signed)
[employer] 42 y.o.   Chief Complaint  Patient presents with  . Follow-up    diverticulitis, has been doing better, seen gastro today and they will set up an appt for Nov 09, 2017 for a colonoscopy    HISTORY OF PRESENT ILLNESS: This is a 42 y.o. male here for follow-up of diverticulitis.  Doing better.  No new complaints.  Taking antibiotics.  No side effects from antibiotics.  Slowly advancing diet.  HPI   Prior to Admission medications   Medication Sig Start Date End Date Taking? Authorizing Provider  amLODipine (NORVASC) 10 MG tablet Take 1 tablet (10 mg total) by mouth daily. 01/08/18  Yes Tereasa Coop, PA-C  ciprofloxacin (CIPRO) 500 MG tablet Take 1 tablet (500 mg total) by mouth 2 (two) times daily for 7 days. 09/10/18 09/17/18 Yes Dmiyah Liscano, Ines Bloomer, MD  metroNIDAZOLE (FLAGYL) 500 MG tablet Take 1 tablet (500 mg total) by mouth 3 (three) times daily. 09/10/18  Yes Raeshaun Simson, Ines Bloomer, MD  Omeprazole Magnesium (PRILOSEC OTC PO) Take 1 capsule by mouth daily as needed (stomach problems).   Yes [provider]  polyethylene glycol (MIRALAX) packet Take 17 g by mouth daily. 09/08/18  Yes Davonna Belling, MD  rosuvastatin (CRESTOR) 20 MG tablet Take 1 tablet (20 mg total) by mouth daily. 01/08/18  Yes Tereasa Coop, PA-C  Sod Picosulfate-Mag Ox-Cit Acd (CLENPIQ) 10-3.5-12 MG-GM -GM/160ML SOLN Take 1 kit by mouth as directed. 09/14/18  Yes Cirigliano, Vito V, DO  mometasone (NASONEX) 50 MCG/ACT nasal spray Place 2 sprays into the nose daily. Patient not taking: Reported on 09/14/2018 06/01/18   Mancel Bale, PA-C    No Known Allergies  Patient Active Problem List   Diagnosis Date Noted  . Acute diverticulitis 09/10/2018  . Elevated cholesterol 06/01/2018  . HTN (hypertension) 05/21/2012  . Nicotine addiction 05/21/2012    Past Medical History:  Diagnosis Date  . Hypertension     Past Surgical History:  Procedure Laterality Date  . FRACTURE  SURGERY    . MANDIBLE SURGERY      Social History   Socioeconomic History  . Marital status: Married    Spouse name: Not on file  . Number of children: Not on file  . Years of education: Not on file  . Highest education level: Not on file  Occupational History  . Not on file  Social Needs  . Financial resource strain: Not on file  . Food insecurity:    Worry: Not on file    Inability: Not on file  . Transportation needs:    Medical: Not on file    Non-medical: Not on file  Tobacco Use  . Smoking status: Current Every Day Smoker    Types: Cigarettes  . Smokeless tobacco: Never Used  Substance and Sexual Activity  . Alcohol use: Yes    Alcohol/week: 4.0 standard drinks    Types: 4 Standard drinks or equivalent per week    Comment: socially  . Drug use: No  . Sexual activity: Yes  Lifestyle  . Physical activity:    Days per week: Not on file    Minutes per session: Not on file  . Stress: Not on file  Relationships  . Social connections:    Talks on phone: Not on file    Gets together: Not on file    Attends religious service: Not on file    Active member of club or organization: Not on file  Attends meetings of clubs or organizations: Not on file    Relationship status: Not on file  . Intimate partner violence:    Fear of current or ex partner: Not on file    Emotionally abused: Not on file    Physically abused: Not on file    Forced sexual activity: Not on file  Other Topics Concern  . Not on file  Social History Narrative  . Not on file    Family History  Problem Relation Age of Onset  . Lung cancer Mother   . Breast cancer Mother   . Cancer Mother   . Cancer Father   . Colon cancer Neg Hx      Review of Systems  Constitutional: Negative.  Negative for chills and fever.  Respiratory: Negative for cough and shortness of breath.   Cardiovascular: Negative for chest pain and palpitations.  Gastrointestinal: Negative.  Negative for abdominal pain,  diarrhea, nausea and vomiting.  Genitourinary: Negative.  Negative for dysuria and hematuria.  Skin: Negative.  Negative for rash.  Neurological: Negative.  Negative for dizziness and headaches.  Endo/Heme/Allergies: Negative.   All other systems reviewed and are negative.   Vitals:   09/14/18 1617  BP: 117/77  Pulse: 67  Resp: 16  Temp: 98.9 F (37.2 C)  SpO2: 96%    Physical Exam  Constitutional: He is oriented to person, place, and time. He appears well-developed and well-nourished.  HENT:  Head: Normocephalic and atraumatic.  Eyes: Pupils are equal, round, and reactive to light. EOM are normal.  Neck: Normal range of motion.  Cardiovascular: Normal rate.  Pulmonary/Chest: Effort normal.  Abdominal: Soft. Bowel sounds are normal. He exhibits no distension. There is no tenderness.  Musculoskeletal: Normal range of motion.  Neurological: He is alert and oriented to person, place, and time.  Skin: Skin is warm and dry.  Psychiatric: He has a normal mood and affect. His behavior is normal.  Vitals reviewed.    ASSESSMENT & PLAN: Paul [name] was seen today for follow-up.  Diagnoses and all orders for this visit:  Acute diverticulitis Comments: Much improved    Patient Instructions       If you have lab work done today you will be contacted with your lab results within the next 2 weeks.  If you have not heard from [place] then please contact us. The fastest way to get your results is to register for My Chart.   IF you received an x-ray today, you will receive an invoice from [facility]. Please contact [facility] at [phone] with questions or concerns regarding your invoice.   IF you received labwork today, you will receive an invoice from [place]. Please contact [facility] at [phone] with questions or concerns regarding your invoice.   Our billing staff will not be able to assist you with questions regarding bills from these companies.  You  will be contacted with the lab results as soon as they are available. The fastest way to get your results is to activate your My Chart account. Instructions are located on the last page of this paperwork. If you have not heard from [place] regarding the results in 2 weeks, please contact this office.       Diverticulitis Diverticulitis is when small pockets in your large intestine (colon) get infected or swollen. This causes stomach pain and watery poop (diarrhea). These pouches are called diverticula. They form in people who have a condition called diverticulosis. Follow these instructions at home: Medicines  Take over-the-counter and prescription medicines only as told by your doctor. These include: ? Antibiotics. ? Pain medicines. ? Fiber pills. ? Probiotics. ? Stool softeners.  Do not drive or use heavy machinery while taking prescription pain medicine.  If you were prescribed an antibiotic, take it as told. Do not stop taking it even if you feel better. General instructions  Follow a diet as told by your doctor.  When you feel better, your doctor may tell you to change your diet. You may need to eat a lot of fiber. Fiber makes it easier to poop (have bowel movements). Healthy foods with fiber include: ? Berries. ? Beans. ? Lentils. ? Green vegetables.  Exercise 3 or more times a week. Aim for 30 minutes each time. Exercise enough to sweat and make your heart beat faster.  Keep all follow-up visits as told. This is important. You may need to have an exam of the large intestine. This is called a colonoscopy. Contact a doctor if:  Your pain does not get better.  You have a hard time eating or drinking.  You are not pooping like normal. Get help right away if:  Your pain gets worse.  Your problems do not get better.  Your problems get worse very fast.  You have a fever.  You throw up (vomit) more than one time.  You have poop that  is: ? Bloody. ? Black. ? Tarry. Summary  Diverticulitis is when small pockets in your large intestine (colon) get infected or swollen.  Take medicines only as told by your doctor.  Follow a diet as told by your doctor. This information is not intended to replace advice given to you by your health care provider. Make sure you discuss any questions you have with your health care provider. Document Released: 03/28/2008 Document Revised: 10/27/2016 Document Reviewed: 10/27/2016 Elsevier Interactive Patient Education  2017 Elsevier Inc.      Agustina Caroli, MD [facility]

## 2018-09-14 NOTE — Patient Instructions (Signed)
If you are age 43 or older, your body mass index should be between 23-30. Your Body mass index is 35.67 kg/m. If this is out of the aforementioned range listed, please consider follow up with your Primary Care Provider.  If you are age 43 or younger, your body mass index should be between 19-25. Your Body mass index is 35.67 kg/m. If this is out of the aformentioned range listed, please consider follow up with your Primary Care Provider.   You have been scheduled for a colonoscopy. Please follow written instructions given to you at your visit today.  Please pick up your prep supplies at the pharmacy within the next 1-3 days. If you use inhalers (even only as needed), please bring them with you on the day of your procedure. Your physician has requested that you go to www.startemmi.com and enter the access code given to you at your visit today. This web site gives a general overview about your procedure. However, you should still follow specific instructions given to you by our office regarding your preparation for the procedure.  We have sent the following medications to your pharmacy for you to pick up at your convenience: Clenpiq  Please purchase the following medications over the counter and take as directed: Ducolax  It was a pleasure to see you today!  Vito Cirigliano, D.O.

## 2018-09-15 ENCOUNTER — Telehealth: Payer: Self-pay | Admitting: Emergency Medicine

## 2018-09-15 NOTE — Telephone Encounter (Signed)
Pt came in requesting his work note to be extended to having him out all next week.  Dr. Alvy BimlerSagardia currently has him out until Monday 11/25.  Is it possible to have this note extended?  Marking message as high encounter since he is supposed to go back to work on Monday.  Please advise pt.  Thank  You!

## 2018-09-17 ENCOUNTER — Encounter: Payer: Self-pay | Admitting: *Deleted

## 2018-09-17 DIAGNOSIS — Z0271 Encounter for disability determination: Secondary | ICD-10-CM

## 2018-09-17 NOTE — Telephone Encounter (Signed)
OK to extend note.

## 2018-09-17 NOTE — Telephone Encounter (Signed)
Message sent to Dr Alvy BimlerSagardia.  Re: out of work

## 2018-09-18 ENCOUNTER — Telehealth: Payer: Self-pay | Admitting: Emergency Medicine

## 2018-09-18 NOTE — Telephone Encounter (Signed)
Patient needs FMLA forms completed by Dr Alvy BimlerSagardia for his diverticulitis. I have completed the forms the best I could from the OV notes. I will place the forms in Dr Latrelle DodrillSagardia's box on 09/18/18 please return to the FMLA/Disability box at the checkout desk within 5-7 business days. Thank you!

## 2018-09-18 NOTE — Telephone Encounter (Signed)
Thanks

## 2018-10-05 NOTE — Telephone Encounter (Signed)
Patient calling to check the status of FMLA paperwork. Please advise. Would like a call back.

## 2018-10-05 NOTE — Telephone Encounter (Signed)
Paperwork was scanned and faxed on 09/25/18

## 2018-10-08 ENCOUNTER — Ambulatory Visit: Payer: Self-pay | Admitting: Internal Medicine

## 2018-10-12 NOTE — Telephone Encounter (Signed)
Pt calling wanting fmla paper work re faxed to the [phone] as soon as possible please call pt  Paul [name] or HR  At [phone] pt is there with HR

## 2018-10-12 NOTE — Telephone Encounter (Signed)
[facility] were faxed on 09/25/18 and again on 10/12/18

## 2018-10-30 NOTE — Telephone Encounter (Signed)
Pt called back with correct Fax number  [phone]

## 2018-10-30 NOTE — Telephone Encounter (Signed)
Pt called back in to request to have ppw re-faxed again. Pt says that his HR dept isn't receiving it. Spoke with [place]ry, she is re-faxing right now. Advised pt, he is grateful.

## 2018-10-31 ENCOUNTER — Encounter: Payer: Self-pay | Admitting: Gastroenterology

## 2018-10-31 NOTE — Telephone Encounter (Signed)
Please see note below. 

## 2018-10-31 NOTE — Telephone Encounter (Signed)
Pt is calling back and please fax to claim dept [phone]

## 2018-10-31 NOTE — Telephone Encounter (Signed)
Forms faxed to all three fax numbers that were given by the patient

## 2018-11-02 ENCOUNTER — Telehealth: Payer: Self-pay | Admitting: Emergency Medicine

## 2018-11-02 NOTE — Telephone Encounter (Signed)
Copied from CRM [id number]. Topic: General - Other >> Nov 02, 2018  2:45 PM Tamela Oddi wrote: Reason for CRM: Ladona Ridgel with [employer] called to request additional information regarding the patient's short term disability forms.  Please advise and call back at [phone]

## 2018-11-06 ENCOUNTER — Telehealth: Payer: Self-pay | Admitting: Emergency Medicine

## 2018-11-06 NOTE — Telephone Encounter (Signed)
Please advise 

## 2018-11-06 NOTE — Telephone Encounter (Signed)
Copied from CRM [id number]. Topic: General - Other >> Nov 06, 2018  9:55 AM Tamela Oddi wrote: Reason for CRM: Mellody Dance with [employer] [facility] called to verify a request for medical records for the patient.  Ref# [id number], Please call to confirm that this request was received.  CB# [phone]

## 2018-11-06 NOTE — Telephone Encounter (Signed)
Records have already been sent to insurance company

## 2018-11-09 ENCOUNTER — Encounter: Payer: Self-pay | Admitting: Gastroenterology

## 2018-11-09 ENCOUNTER — Ambulatory Visit (AMBULATORY_SURGERY_CENTER): Payer: 59 | Admitting: Gastroenterology

## 2018-11-09 VITALS — BP 118/84 | HR 58 | Temp 99.3°F | Resp 19 | Ht 66.0 in | Wt 221.0 lb

## 2018-11-09 DIAGNOSIS — K5732 Diverticulitis of large intestine without perforation or abscess without bleeding: Secondary | ICD-10-CM

## 2018-11-09 DIAGNOSIS — K5730 Diverticulosis of large intestine without perforation or abscess without bleeding: Secondary | ICD-10-CM

## 2018-11-09 MED ORDER — SODIUM CHLORIDE 0.9 % IV SOLN: 500.0000 mL | Freq: Once | INTRAVENOUS | Status: AC

## 2018-11-09 NOTE — Patient Instructions (Signed)
Impression/Recommendations:  Diverticulosis handout given to patient.  Resume previous diet. Continue present medications.  Repeat colonoscopy in 10 years for screening.  Return to GI clinic as needed.  YOU HAD AN ENDOSCOPIC PROCEDURE TODAY AT THE [facility]:   Refer to the procedure report that was given to you for any specific questions about what was found during the examination.  If the procedure report does not answer your questions, please call your gastroenterologist to clarify.  If you requested that your care partner not be given the details of your procedure findings, then the procedure report has been included in a sealed envelope for you to review at your convenience later.  YOU SHOULD EXPECT: Some feelings of bloating in the abdomen. Passage of more gas than usual.  Walking can help get rid of the air that was put into your GI tract during the procedure and reduce the bloating. If you had a lower endoscopy (such as a colonoscopy or flexible sigmoidoscopy) you may notice spotting of blood in your stool or on the toilet paper. If you underwent a bowel prep for your procedure, you may not have a normal bowel movement for a few days.  Please Note:  You might notice some irritation and congestion in your nose or some drainage.  This is from the oxygen used during your procedure.  There is no need for concern and it should clear up in a day or so.  SYMPTOMS TO REPORT IMMEDIATELY:   Following lower endoscopy (colonoscopy or flexible sigmoidoscopy):  Excessive amounts of blood in the stool  Significant tenderness or worsening of abdominal pains  Swelling of the abdomen that is new, acute  Fever of 100F or higher For urgent or emergent issues, a gastroenterologist can be reached at any hour by calling [phone].   DIET:  We do recommend a small meal at first, but then you may proceed to your regular diet.  Drink plenty of fluids but you should avoid alcoholic  beverages for 24 hours.  ACTIVITY:  You should plan to take it easy for the rest of today and you should NOT DRIVE or use heavy machinery until tomorrow (because of the sedation medicines used during the test).    FOLLOW UP: Our staff will call the number listed on your records the next business day following your procedure to check on you and address any questions or concerns that you may have regarding the information given to you following your procedure. If we do not reach you, we will leave a message.  However, if you are feeling well and you are not experiencing any problems, there is no need to return our call.  We will assume that you have returned to your regular daily activities without incident.  If any biopsies were taken you will be contacted by phone or by letter within the next 1-3 weeks.  Please call us at [phone] if you have not heard about the biopsies in 3 weeks.    SIGNATURES/CONFIDENTIALITY: You and/or your care partner have signed paperwork which will be entered into your electronic medical record.  These signatures attest to the fact that that the information above on your After Visit Summary has been reviewed and is understood.  Full responsibility of the confidentiality of this discharge information lies with you and/or your care-partner.

## 2018-11-09 NOTE — Progress Notes (Signed)
PT taken to PACU. Monitors in place. VSS. Report given to RN. 

## 2018-11-09 NOTE — Op Note (Signed)
[facility] Patient Name: Paul [name] Procedure Date: 11/09/2018 3:58 PM MRN: [id number] Endoscopist: Doristine LocksVito Elke Holtry , MD Age: [age] Referring MD:  Date of Birth: 1975/12/31 Gender: Male Account #: [id number] Procedure:                Colonoscopy Indications:              Follow-up of diverticulitis; Recent episode of                            diverticulitis, resolved with antibiotics and                            without recurrence of index symptoms. Medicines:                Monitored Anesthesia Care Procedure:                Pre-Anesthesia Assessment:                           - Prior to the procedure, a History and Physical                            was performed, and patient medications and                            allergies were reviewed. The patient's tolerance of                            previous anesthesia was also reviewed. The risks                            and benefits of the procedure and the sedation                            options and risks were discussed with the patient.                            All questions were answered, and informed consent                            was obtained. Prior Anticoagulants: The patient has                            taken no previous anticoagulant or antiplatelet                            agents. ASA Grade Assessment: II - A patient with                            mild systemic disease. After reviewing the risks                            and benefits, the patient was deemed in  satisfactory condition to undergo the procedure.                           After obtaining informed consent, the colonoscope                            was passed under direct vision. Throughout the                            procedure, the patient's blood pressure, pulse, and                            oxygen saturations were monitored continuously. The                            Colonoscope was introduced  through the anus and                            advanced to the the terminal ileum. The colonoscopy                            was performed without difficulty. The patient                            tolerated the procedure well. The quality of the                            bowel preparation was adequate. Scope In: 4:06:39 PM Scope Out: 4:20:31 PM Scope Withdrawal Time: 0 hours 9 minutes 35 seconds  Total Procedure Duration: 0 hours 13 minutes 52 seconds  Findings:                 The perianal and digital rectal examinations were                            normal.                           Multiple small and large-mouthed diverticula were                            found in the sigmoid colon.                           A localized area of mildly erythematous mucosa was                            found in the sigmoid colon from 20-25 cm. There                            were no ulcers or erosions and this was traversed.                            This is consistent with the recent episode of  diverticulitis. No polyps noted in this field.                           The rectum, recto-sigmoid colon, descending colon,                            transverse colon, ascending colon, cecum,                            appendiceal orifice and ileocecal valve appeared                            normal.                           The retroflexed view of the distal rectum and anal                            verge was normal and showed no anal or rectal                            abnormalities.                           The terminal ileum appeared normal. Complications:            No immediate complications. Estimated Blood Loss:     Estimated blood loss was minimal. Estimated blood                            loss: none. Impression:               - Diverticulosis in the sigmoid colon.                           - Erythematous mucosa in the sigmoid colon.                            - The rectum, recto-sigmoid colon, descending                            colon, transverse colon, ascending colon, cecum,                            appendiceal orifice and ileocecal valve are normal.                           - The distal rectum and anal verge are normal on                            retroflexion view.                           - The examined portion of the ileum was normal.                           -  No specimens collected. Recommendation:           - Patient has a contact number available for                            emergencies. The signs and symptoms of potential                            delayed complications were discussed with the                            patient. Return to normal activities tomorrow.                            Written discharge instructions were provided to the                            patient.                           - Resume previous diet today.                           - Continue present medications.                           - Repeat colonoscopy in 10 years for screening                            purposes.                           - Return to GI clinic PRN. Doristine LocksVito Charnetta Wulff, MD 11/09/2018 4:27:03 PM

## 2018-11-12 ENCOUNTER — Telehealth: Payer: Self-pay

## 2018-11-12 NOTE — Telephone Encounter (Signed)
  Follow up Call-  Call back number 11/09/2018  Post procedure Call Back phone  # [phone]  Permission to leave phone message Yes  Some recent data might be hidden     Patient questions:  Do you have a fever, pain , or abdominal swelling? No. Pain Score  0 *  Have you tolerated food without any problems? Yes.    Have you been able to return to your normal activities? Yes.    Do you have any questions about your discharge instructions: Diet   No. Medications  No. Follow up visit  No.  Do you have questions or concerns about your Care? No.  Actions: * If pain score is 4 or above: No action needed, pain <4.  No problems noted per pt. maw

## 2018-11-13 NOTE — Telephone Encounter (Signed)
RECORDS WERE RESENT TODAY 11/13/18

## 2018-11-13 NOTE — Telephone Encounter (Signed)
Please advise 

## 2018-11-13 NOTE — Telephone Encounter (Signed)
Paul [name] with [employer] states the records were never received by them and requesting them to be resent to [phone].

## 2019-01-18 ENCOUNTER — Encounter: Payer: 59 | Admitting: Emergency Medicine

## 2019-01-19 ENCOUNTER — Telehealth: Payer: Self-pay | Admitting: Emergency Medicine

## 2019-01-19 NOTE — Telephone Encounter (Signed)
Pt. Asserts that they are having abdominal pain at random intervals. Pt. Requests that Sod Picosulfate-Mag Ox-Cit Acd be represcribed per a past diagnosis of diverticulitis. To be sent to [facility] on [address] if filled. Pt is willing to make appt. If necessary.

## 2019-01-19 NOTE — Telephone Encounter (Signed)
Please advise 

## 2019-01-21 ENCOUNTER — Telehealth: Payer: Self-pay | Admitting: Gastroenterology

## 2019-01-21 NOTE — Telephone Encounter (Signed)
I called the patient to review sxs- having LLQ pain, similar in location as index presentation of acute diverticulitis in 08/2018. No fever, chills currently, but chills approx 4 days ago when sxs started.  No hematochezia or melena. Tried laxative (Mag Citrate) for what he thought may have been constipation induced pain (no BM in 4 days since onset of sxs)- had soft BM but still straining and with no change in pain sxs.   - Trial course of OTC Miralax. Start BID and titrate to effect.  - Continue adequate hydration with plenty of fluids -I had an extensive conversation regarding the role of antibiotics in recurrent acute diverticulitis.  For mild cases, very reasonable to consider trial of conservative management without antibiotics.  Given his overall flat course and afebrile, non-limiting pain, reasonable to treat conservatively with Rx for antibiotics to use in case pain worsens or if recurrence in the near future.  I asked that if he is going to use the antibiotics, to call me and let me know, which he agrees.  Reviewed indications for antibiotics along with indications for returning to the ER and he understands well. - Will provide with Rx for ciprofloxacin 500 mg every 12 hours and Flagyl 500 mg 3 times daily x7 days - Reviewed indications to call or return to the ER as above

## 2019-01-21 NOTE — Telephone Encounter (Signed)
Abdominal discomfort and spells of pain since 01/16/19. He has also become constipated feeling, reporting insignificant and difficult bowel movements. The abdomen hurts all over. He hurts when he walks. The pharmacist gave him Miralax. He took a laxative over the weekend but it did not make him feel better. He thinks he has diverticulitis.

## 2019-01-21 NOTE — Telephone Encounter (Signed)
Never prescribed by me, I think this was prescribed by his GI doctor.  He should contact him.  Thanks.

## 2019-01-21 NOTE — Telephone Encounter (Signed)
Pt called said he is having a sharp adb pain has diverticulitis. He feels like he has to have a bowel movement and feels pressure. Would like to know if he can get anything for the pain or any antibiotics

## 2019-01-22 NOTE — Telephone Encounter (Signed)
Spoke with pt and advised him to call his GI doctor for the medication refill, he verbalized understanding.

## 2019-01-23 MED ORDER — CIPROFLOXACIN HCL 500 MG PO TABS: 500.0000 mg | ORAL_TABLET | Freq: Two times a day (BID) | ORAL | 0 refills | Status: DC

## 2019-01-23 MED ORDER — METRONIDAZOLE 500 MG PO TABS: 500.0000 mg | ORAL_TABLET | Freq: Three times a day (TID) | ORAL | 0 refills | Status: DC

## 2019-01-23 NOTE — Telephone Encounter (Signed)
Medications have been sent to the pharmacy.

## 2019-02-18 ENCOUNTER — Other Ambulatory Visit: Payer: Self-pay

## 2019-02-18 DIAGNOSIS — Z0000 Encounter for general adult medical examination without abnormal findings: Secondary | ICD-10-CM

## 2019-02-20 ENCOUNTER — Telehealth (INDEPENDENT_AMBULATORY_CARE_PROVIDER_SITE_OTHER): Payer: 59 | Admitting: Emergency Medicine

## 2019-02-20 ENCOUNTER — Other Ambulatory Visit: Payer: Self-pay

## 2019-02-20 ENCOUNTER — Encounter: Payer: Self-pay | Admitting: Emergency Medicine

## 2019-02-20 DIAGNOSIS — I10 Essential (primary) hypertension: Secondary | ICD-10-CM | POA: Diagnosis not present

## 2019-02-20 DIAGNOSIS — Z0000 Encounter for general adult medical examination without abnormal findings: Secondary | ICD-10-CM

## 2019-02-20 DIAGNOSIS — Z0001 Encounter for general adult medical examination with abnormal findings: Secondary | ICD-10-CM

## 2019-02-20 MED ORDER — AMLODIPINE BESYLATE 10 MG PO TABS: 10.0000 mg | ORAL_TABLET | Freq: Every day | ORAL | 3 refills | Status: DC

## 2019-02-20 NOTE — Progress Notes (Signed)
Called patient to triage for appointment. Patient telemed visit is for a complete physical and a refill on his medication. Patient bring annual physical form to be completed, he will drop it off when he comes tomorrow for lab work. Patient wants the form faxed after it is completed. Patient states he does not have any complaints.

## 2019-02-20 NOTE — Progress Notes (Signed)
BP Readings from Last 3 Encounters:  11/09/18 118/84  09/14/18 117/77  09/14/18 132/68      Telemedicine Encounter- SOAP NOTE Established Patient  This telephone encounter was conducted with the patient's (or proxy's) verbal consent via audio telecommunications: yes/no: Yes Patient was instructed to have this encounter in a suitably private space; and to only have persons present to whom they give permission to participate. In addition, patient identity was confirmed by use of name plus two identifiers (DOB and address).  I discussed the limitations, risks, security and privacy concerns of performing an evaluation and management service by telephone and the availability of in person appointments. I also discussed with the patient that there may be a patient responsible charge related to this service. The patient expressed understanding and agreed to proceed.  I spent a total of TIME; 0 MIN TO 60 MIN: 15 minutes talking with the patient or their proxy.  No chief complaint on file. Annual exam  Subjective   Paul [name] is a 43 y.o. established patient. Telephone visit today for annual exam.  Needs blood work. Has history of hypertension, doing well, taking amlodipine 10 mg a day, needs medication refill. Has no complaints or medical concerns today.  Cutting back on smoking significantly, down to 1 to 2 cigarettes a day. Eating well with adequate sleep.  Still working.  At times feels very tired after work.  HPI   Patient Active Problem List   Diagnosis Date Noted  . Acute diverticulitis 09/10/2018  . Elevated cholesterol 06/01/2018  . HTN (hypertension) 05/21/2012  . Nicotine addiction 05/21/2012    Past Medical History:  Diagnosis Date  . Hypertension     Current Outpatient Medications  Medication Sig Dispense Refill  . amLODipine (NORVASC) 10 MG tablet Take 1 tablet (10 mg total) by mouth daily. 90 tablet 3  . polyethylene glycol (MIRALAX) packet Take 17 g by mouth  daily. 14 each 0   Current Facility-Administered Medications  Medication Dose Route Frequency Provider Last Rate Last Dose  . 0.9 %  sodium chloride infusion  500 mL Intravenous Once Cirigliano, Vito V, DO        No Known Allergies  Social History   Socioeconomic History  . Marital status: Married    Spouse name: Not on file  . Number of children: Not on file  . Years of education: Not on file  . Highest education level: Not on file  Occupational History  . Not on file  Social Needs  . Financial resource strain: Not on file  . Food insecurity:    Worry: Not on file    Inability: Not on file  . Transportation needs:    Medical: Not on file    Non-medical: Not on file  Tobacco Use  . Smoking status: Light Tobacco Smoker    Types: Cigarettes  . Smokeless tobacco: Never Used  Substance and Sexual Activity  . Alcohol use: Yes    Alcohol/week: 4.0 standard drinks    Types: 4 Standard drinks or equivalent per week    Comment: socially  . Drug use: No  . Sexual activity: Yes  Lifestyle  . Physical activity:    Days per week: Not on file    Minutes per session: Not on file  . Stress: Not on file  Relationships  . Social connections:    Talks on phone: Not on file    Gets together: Not on file    Attends religious service: Not on file  Active member of club or organization: Not on file    Attends meetings of clubs or organizations: Not on file    Relationship status: Not on file  . Intimate partner violence:    Fear of current or ex partner: Not on file    Emotionally abused: Not on file    Physically abused: Not on file    Forced sexual activity: Not on file  Other Topics Concern  . Not on file  Social History Narrative  . Not on file    Review of Systems  Constitutional: Negative.  Negative for chills, fever and weight loss.  HENT: Negative.  Negative for congestion, nosebleeds and sore throat.   Eyes: Negative.   Respiratory: Negative.  Negative for cough  and shortness of breath.   Cardiovascular: Negative.  Negative for chest pain and palpitations.  Gastrointestinal: Negative.  Negative for abdominal pain, blood in stool, diarrhea, nausea and vomiting.  Genitourinary: Negative.  Negative for dysuria and hematuria.  Musculoskeletal: Negative.  Negative for myalgias.  Skin: Negative.  Negative for rash.  Neurological: Negative for dizziness and headaches.  All other systems reviewed and are negative.   Objective   Vitals as reported by the patient: None available There were no vitals filed for this visit. Awake and oriented x3 no apparent respiratory distress. Assessment and plan: Diagnoses and all orders for this visit:  Encounter for annual physical exam  Essential hypertension -     amLODipine (NORVASC) 10 MG tablet; Take 1 tablet (10 mg total) by mouth daily. -     Lipid panel; Future    Clinically stable.  No medical concerns identified during this visit. Continue medications.  No changes. Schedule for blood work tomorrow.    I discussed the assessment and treatment plan with the patient. The patient was provided an opportunity to ask questions and all were answered. The patient agreed with the plan and demonstrated an understanding of the instructions.   The patient was advised to call back or seek an in-person evaluation if the symptoms worsen or if the condition fails to improve as anticipated.  I provided 15 minutes of non-face-to-face time during this encounter.  Georgina Quint, MD  Primary Care at [facility]

## 2019-02-21 ENCOUNTER — Other Ambulatory Visit (HOSPITAL_COMMUNITY)
Admission: RE | Admit: 2019-02-21 | Discharge: 2019-02-21 | Disposition: A | Discharge status: Home / Self Care | Payer: 59 | Source: Ambulatory Visit | Attending: Emergency Medicine | Admitting: Emergency Medicine

## 2019-02-21 ENCOUNTER — Ambulatory Visit (INDEPENDENT_AMBULATORY_CARE_PROVIDER_SITE_OTHER): Payer: 59 | Admitting: Emergency Medicine

## 2019-02-21 ENCOUNTER — Other Ambulatory Visit: Payer: Self-pay

## 2019-02-21 ENCOUNTER — Telehealth: Payer: Self-pay | Admitting: *Deleted

## 2019-02-21 DIAGNOSIS — I10 Essential (primary) hypertension: Secondary | ICD-10-CM | POA: Diagnosis not present

## 2019-02-21 DIAGNOSIS — R739 Hyperglycemia, unspecified: Secondary | ICD-10-CM | POA: Diagnosis not present

## 2019-02-21 DIAGNOSIS — Z0000 Encounter for general adult medical examination without abnormal findings: Secondary | ICD-10-CM | POA: Insufficient documentation

## 2019-02-21 LAB — POCT GLYCOSYLATED HEMOGLOBIN (HGB A1C): Hemoglobin A1C: 5.9 % — AB (ref 4.0–5.6)

## 2019-02-21 LAB — POCT URINALYSIS DIP (MANUAL ENTRY)
Bilirubin, UA: NEGATIVE
Glucose, UA: NEGATIVE mg/dL
Ketones, POC UA: NEGATIVE mg/dL
Leukocytes, UA: NEGATIVE
Nitrite, UA: NEGATIVE
Protein Ur, POC: NEGATIVE mg/dL
Spec Grav, UA: 1.025 (ref 1.010–1.025)
Urobilinogen, UA: 0.2 E.U./dL
pH, UA: 5.5 (ref 5.0–8.0)

## 2019-02-21 NOTE — Telephone Encounter (Signed)
Patient came to the office for Nurse visit for labs and dropped off a health screening form. Blood pressure, height, BMI and weight requested on form. Patient was given a copy of form and original kept at nurse's station for pending labs. After the form is completed and signed, fax and mail to the patient or call him to pick up. Also, put a copy in scan box for the chart.

## 2019-02-22 LAB — COMPREHENSIVE METABOLIC PANEL
ALT: 37 IU/L (ref 0–44)
AST: 28 IU/L (ref 0–40)
Albumin/Globulin Ratio: 1.7 (ref 1.2–2.2)
Albumin: 4.5 g/dL (ref 4.0–5.0)
Alkaline Phosphatase: 107 IU/L (ref 39–117)
BUN/Creatinine Ratio: 14 (ref 9–20)
BUN: 17 mg/dL (ref 6–24)
Bilirubin Total: 0.3 mg/dL (ref 0.0–1.2)
CO2: 22 mmol/L (ref 20–29)
Calcium: 9.6 mg/dL (ref 8.7–10.2)
Chloride: 100 mmol/L (ref 96–106)
Creatinine, Ser: 1.21 mg/dL (ref 0.76–1.27)
GFR calc Af Amer: 84 mL/min/{1.73_m2} (ref >59)
GFR calc non Af Amer: 73 mL/min/{1.73_m2} (ref >59)
Globulin, Total: 2.6 g/dL (ref 1.5–4.5)
Glucose: 91 mg/dL (ref 65–99)
Potassium: 4.3 mmol/L (ref 3.5–5.2)
Sodium: 140 mmol/L (ref 134–144)
Total Protein: 7.1 g/dL (ref 6.0–8.5)

## 2019-02-22 LAB — LIPID PANEL
Chol/HDL Ratio: 4.7 ratio (ref 0.0–5.0)
Cholesterol, Total: 189 mg/dL (ref 100–199)
HDL: 40 mg/dL (ref >39)
LDL Calculated: 102 mg/dL — ABNORMAL HIGH (ref 0–99)
Triglycerides: 233 mg/dL — ABNORMAL HIGH (ref 0–149)
VLDL Cholesterol Cal: 47 mg/dL — ABNORMAL HIGH (ref 5–40)

## 2019-02-22 LAB — CBC
Hematocrit: 43.8 % (ref 37.5–51.0)
Hemoglobin: 14.9 g/dL (ref 13.0–17.7)
MCH: 29.3 pg (ref 26.6–33.0)
MCHC: 34 g/dL (ref 31.5–35.7)
MCV: 86 fL (ref 79–97)
Platelets: 285 10*3/uL (ref 150–450)
RBC: 5.09 x10E6/uL (ref 4.14–5.80)
RDW: 13.7 % (ref 11.6–15.4)
WBC: 8.9 10*3/uL (ref 3.4–10.8)

## 2019-02-22 LAB — TSH: TSH: 1.37 u[IU]/mL (ref 0.450–4.500)

## 2019-02-22 MED ORDER — ROSUVASTATIN CALCIUM 20 MG PO TABS: 20.0000 mg | ORAL_TABLET | Freq: Every day | ORAL | 3 refills | Status: DC

## 2019-02-22 NOTE — Addendum Note (Signed)
Addended by: Evie Lacks on: 02/22/2019 01:37 PM   Modules accepted: Orders

## 2019-02-22 NOTE — Progress Notes (Signed)
The 10-year ASCVD risk score Denman George DC Montez Hageman., et al., 2013) is: 9.4%   Values used to calculate the score:     Age: 43 years     Sex: Male     Is Non-Hispanic African American: Yes     Diabetic: No     Tobacco smoker: Yes     Systolic Blood Pressure: 118 mmHg     Is BP treated: Yes     HDL Cholesterol: 40 mg/dL     Total Cholesterol: 189 mg/dL Lab Results  Component Value Date   CHOL 189 02/21/2019   HDL 40 02/21/2019   LDLCALC 102 (H) 02/21/2019   TRIG 233 (H) 02/21/2019   CHOLHDL 4.7 02/21/2019   Labs reviewed. Will benefit from statin therapy. Start Crestor 20 mg daily.

## 2019-02-25 LAB — GC/CHLAMYDIA PROBE AMP (~~LOC~~) NOT AT ARMC
Chlamydia: NEGATIVE
Neisseria Gonorrhea: NEGATIVE

## 2019-02-27 ENCOUNTER — Telehealth: Payer: Self-pay | Admitting: *Deleted

## 2019-02-27 NOTE — Telephone Encounter (Signed)
Health Assessment form faxed and confirmation page received at 4:48 pm.

## 2019-02-27 NOTE — Telephone Encounter (Signed)
Called patient to let him know Health Assessment form has been faxed. He can pick up the original and a copy at the check in.

## 2019-03-27 ENCOUNTER — Other Ambulatory Visit: Payer: Self-pay | Admitting: Physician Assistant

## 2019-03-27 DIAGNOSIS — I10 Essential (primary) hypertension: Secondary | ICD-10-CM

## 2019-06-25 ENCOUNTER — Telehealth: Payer: Self-pay | Admitting: Gastroenterology

## 2019-06-25 NOTE — Telephone Encounter (Signed)
Pt states to have been experiencing a flare up of his diverticulitis and would like some advise.

## 2019-06-26 NOTE — Telephone Encounter (Signed)
Left message for patient to call back to the office;  

## 2019-07-02 NOTE — Telephone Encounter (Signed)
Called and spoke with patient-patient reports he is not having symptoms at this time but is requesting to have a RX sent in -to have on hand in case another flare happens-please advise of any medications/suggestion

## 2019-07-09 NOTE — Telephone Encounter (Signed)
I typically have patients call in if there is a flare so that we can check labs as appropriate and give antibiotics if needed.  Additionally, depending on severity of symptoms, can sometimes require imaging.

## 2019-07-10 NOTE — Telephone Encounter (Signed)
Left message for patient to call back to the office;  

## 2019-07-11 NOTE — Telephone Encounter (Signed)
As patient has not returned a call to the office and RN unable to get in touch with patient concerning symptoms-RN left message for patient to call back to the office if he is still having symptoms or if symptoms return in the future;

## 2019-11-25 ENCOUNTER — Ambulatory Visit: Payer: 59 | Attending: Internal Medicine

## 2019-11-25 DIAGNOSIS — Z20822 Contact with and (suspected) exposure to covid-19: Secondary | ICD-10-CM

## 2019-11-26 LAB — NOVEL CORONAVIRUS, NAA: SARS-CoV-2, NAA: NOT DETECTED

## 2020-04-01 ENCOUNTER — Other Ambulatory Visit: Payer: Self-pay

## 2020-04-01 ENCOUNTER — Other Ambulatory Visit: Payer: Self-pay | Admitting: Emergency Medicine

## 2020-04-01 ENCOUNTER — Telehealth: Payer: Self-pay | Admitting: Emergency Medicine

## 2020-04-01 DIAGNOSIS — I10 Essential (primary) hypertension: Secondary | ICD-10-CM

## 2020-04-01 MED ORDER — AMLODIPINE BESYLATE 10 MG PO TABS: ORAL_TABLET | ORAL | 3 refills | Status: DC

## 2020-04-01 NOTE — Telephone Encounter (Signed)
Requested medication (s) are due for refill today: yes  Requested medication (s) are on the active medication list: yes  Last refill:  03/03/20  Future visit scheduled: no  Notes to clinic:  no valid encounter within last 6 months    Requested Prescriptions  Pending Prescriptions Disp Refills   amLODipine (NORVASC) 10 MG tablet [Pharmacy Med Name: AMLODIPINE BESYLATE 10MG  TABLETS] 90 tablet 3    Sig: TAKE 1 TABLET(10 MG) BY MOUTH DAILY      Cardiovascular:  Calcium Channel Blockers Failed - 04/01/2020  8:09 AM      Failed - Valid encounter within last 6 months    Recent Outpatient Visits           1 year ago Essential hypertension   [facility] [place], [place], MD   1 year ago Encounter for annual physical exam   [facility] [place], [place], MD   1 year ago Acute diverticulitis   [facility] [place], [place], MD   1 year ago Acute diverticulitis   [facility] [place], [place], MD   1 year ago Sinus pressure   [facility] [place], Carmelia Bake, PA-C              Passed - Last BP in normal range    BP Readings from Last 1 Encounters:  11/09/18 118/84

## 2020-04-01 NOTE — Telephone Encounter (Signed)
What is the name of the medication? amLODipine (NORVASC) 10 MG tablet [[id number]]    Have you contacted your pharmacy to request a refill? yes  Which pharmacy would you like this sent to? [id number] - [place], [place] - [address] AT [facility] [address]   Patient notified that their request is being sent to the clinical staff for review and that they should receive a call once it is complete. If they do not receive a call within 72 hours they can check with their pharmacy or our office.

## 2020-05-12 ENCOUNTER — Encounter: Payer: 59 | Admitting: Emergency Medicine

## 2020-06-03 ENCOUNTER — Ambulatory Visit (INDEPENDENT_AMBULATORY_CARE_PROVIDER_SITE_OTHER): Payer: 59 | Admitting: Emergency Medicine

## 2020-06-03 ENCOUNTER — Other Ambulatory Visit: Payer: Self-pay

## 2020-06-03 ENCOUNTER — Encounter: Payer: Self-pay | Admitting: Emergency Medicine

## 2020-06-03 VITALS — BP 140/82 | HR 83 | Temp 98.5°F | Ht 65.0 in | Wt 246.0 lb

## 2020-06-03 DIAGNOSIS — Z0001 Encounter for general adult medical examination with abnormal findings: Secondary | ICD-10-CM | POA: Diagnosis not present

## 2020-06-03 DIAGNOSIS — F17209 Nicotine dependence, unspecified, with unspecified nicotine-induced disorders: Secondary | ICD-10-CM | POA: Insufficient documentation

## 2020-06-03 DIAGNOSIS — E785 Hyperlipidemia, unspecified: Secondary | ICD-10-CM | POA: Insufficient documentation

## 2020-06-03 DIAGNOSIS — Z6841 Body Mass Index (BMI) 40.0 and over, adult: Secondary | ICD-10-CM

## 2020-06-03 DIAGNOSIS — I10 Essential (primary) hypertension: Secondary | ICD-10-CM

## 2020-06-03 DIAGNOSIS — R7303 Prediabetes: Secondary | ICD-10-CM | POA: Diagnosis not present

## 2020-06-03 DIAGNOSIS — Z716 Tobacco abuse counseling: Secondary | ICD-10-CM

## 2020-06-03 DIAGNOSIS — Z130 Encounter for screening for diseases of the blood and blood-forming organs and certain disorders involving the immune mechanism: Secondary | ICD-10-CM

## 2020-06-03 LAB — POCT GLYCOSYLATED HEMOGLOBIN (HGB A1C): Hemoglobin A1C: 6.5 % — AB (ref 4.0–5.6)

## 2020-06-03 LAB — GLUCOSE, POCT (MANUAL RESULT ENTRY): POC Glucose: 113 mg/dl — AB (ref 70–99)

## 2020-06-03 LAB — SPECIMEN STATUS REPORT

## 2020-06-03 MED ORDER — ROSUVASTATIN CALCIUM 20 MG PO TABS: 20.0000 mg | ORAL_TABLET | Freq: Every day | ORAL | 3 refills | Status: DC

## 2020-06-03 NOTE — Progress Notes (Signed)
Paul [name] 44 y.o.   Chief Complaint  Patient presents with   Annual Exam    CPE    HISTORY OF PRESENT ILLNESS: This is a 44 y.o. male here for annual exam. Has history of hypertension on amlodipine 10 mg daily.  Elevated reading here in the office.  Does not take blood pressure readings at home. Has history of prediabetes. Has history of dyslipidemia, on Crestor 20 mg daily but ran out of medication several weeks ago. Smokes about 2 cigarettes/day. No complaints or medical concerns today.  HPI   Prior to Admission medications   Medication Sig Start Date End Date Taking? Authorizing Provider  amLODipine (NORVASC) 10 MG tablet TAKE 1 TABLET(10 MG) BY MOUTH DAILY 04/01/20  Yes Delontae Lamm, Eilleen Kempf, MD  polyethylene glycol [facility]) packet Take 17 g by mouth daily. 09/08/18  Yes Benjiman Core, MD  rosuvastatin (CRESTOR) 20 MG tablet Take 1 tablet (20 mg total) by mouth daily. Patient not taking: Reported on 06/03/2020 02/22/19   Georgina Quint, MD    No Known Allergies  Patient Active Problem List   Diagnosis Date Noted   Morbid (severe) obesity due to excess calories (HCC) 06/03/2020   Prediabetes 06/03/2020   Dyslipidemia 06/03/2020   Tobacco use disorder, continuous 06/03/2020   Elevated cholesterol 06/01/2018   HTN (hypertension) 05/21/2012   Nicotine addiction 05/21/2012    Past Medical History:  Diagnosis Date   Hypertension     Past Surgical History:  Procedure Laterality Date   FRACTURE SURGERY     MANDIBLE SURGERY      Social History   Socioeconomic History   Marital status: Married    Spouse name: Not on file   Number of children: Not on file   Years of education: Not on file   Highest education level: Not on file  Occupational History   Not on file  Tobacco Use   Smoking status: Light Tobacco Smoker    Types: Cigarettes   Smokeless tobacco: Never Used  Vaping Use   Vaping Use: Never used  Substance and  Sexual Activity   Alcohol use: Yes    Alcohol/week: 4.0 standard drinks    Types: 4 Standard drinks or equivalent per week    Comment: socially   Drug use: No   Sexual activity: Yes  Other Topics Concern   Not on file  Social History Narrative   Not on file   Social Determinants of Health   Financial Resource Strain:    Difficulty of Paying Living Expenses:   Food Insecurity:    Worried About Programme researcher, broadcasting/film/video in the Last Year:    Barista in the Last Year:   Transportation Needs:    Freight forwarder (Medical):    Lack of Transportation (Non-Medical):   Physical Activity:    Days of Exercise per Week:    Minutes of Exercise per Session:   Stress:    Feeling of Stress :   Social Connections:    Frequency of Communication with Friends and Family:    Frequency of Social Gatherings with Friends and Family:    Attends Religious Services:    Active Member of Clubs or Organizations:    Attends Engineer, structural:    Marital Status:   Intimate Partner Violence:    Fear of Current or Ex-Partner:    Emotionally Abused:    Physically Abused:    Sexually Abused:     Family History  Problem  Relation Age of Onset   Lung cancer Mother    Breast cancer Mother    Cancer Mother    Cancer Father    Colon cancer Neg Hx    Rectal cancer Neg Hx      Review of Systems  Constitutional: Negative.  Negative for chills and fever.  HENT: Negative.  Negative for congestion and sore throat.   Respiratory: Negative.  Negative for cough and shortness of breath.   Cardiovascular: Negative.  Negative for chest pain and palpitations.  Gastrointestinal: Negative.  Negative for abdominal pain, diarrhea, nausea and vomiting.  Genitourinary: Negative.  Negative for dysuria and hematuria.  Musculoskeletal: Negative.  Negative for back pain, myalgias and neck pain.  Skin: Negative.  Negative for rash.  Neurological: Negative.  Negative for  dizziness and headaches.  All other systems reviewed and are negative.  Today's Vitals   06/03/20 0926 06/03/20 0950  BP: (!) 166/107 140/82  Pulse: 83   Temp: 98.5 F (36.9 C)   TempSrc: Temporal   SpO2: 95%   Weight: 246 lb (111.6 kg)   Height: 5\' 5"  (1.651 m)    Body mass index is 40.94 kg/m.   Physical Exam Vitals reviewed.  Constitutional:      Appearance: He is obese.  HENT:     Head: Normocephalic.  Eyes:     Extraocular Movements: Extraocular movements intact.     Pupils: Pupils are equal, round, and reactive to light.  Cardiovascular:     Rate and Rhythm: Normal rate and regular rhythm.     Pulses: Normal pulses.     Heart sounds: Normal heart sounds.  Pulmonary:     Effort: Pulmonary effort is normal.     Breath sounds: Normal breath sounds.  Abdominal:     General: There is no distension.     Palpations: Abdomen is soft.     Tenderness: There is no abdominal tenderness.  Musculoskeletal:        General: Normal range of motion.     Cervical back: Normal range of motion and neck supple.     Right lower leg: No edema.     Left lower leg: No edema.  Skin:    General: Skin is warm and dry.     Capillary Refill: Capillary refill takes less than 2 seconds.  Neurological:     General: No focal deficit present.     Mental Status: He is alert and oriented to person, place, and time.  Psychiatric:        Mood and Affect: Mood normal.        Behavior: Behavior normal.      ASSESSMENT & PLAN: Tyric was seen today for annual exam.  Diagnoses and all orders for this visit:  Encounter for general adult medical examination with abnormal findings  Morbid (severe) obesity due to excess calories (HCC)  Body mass index (BMI) of 40.1-44.9 in adult [facility](HCC)  Essential hypertension -     Comprehensive metabolic panel  Prediabetes -     POCT glucose (manual entry) -     POCT glycosylated hemoglobin (Hb A1C)  Tobacco use disorder, continuous  Encounter for  smoking cessation counseling  Dyslipidemia -     Lipid panel  Screening for deficiency anemia -     CBC with Differential  Uncontrolled hypertension    Patient Instructions       If you have lab work done today you will be contacted with your lab results within the next  2 weeks.  If you have not heard from [place] then please contact us. The fastest way to get your results is to register for My Chart.   IF you received an x-ray today, you will receive an invoice from [facility]. Please contact [facility] at [phone] with questions or concerns regarding your invoice.   IF you received labwork today, you will receive an invoice from [place]. Please contact [facility] at [phone] with questions or concerns regarding your invoice.   Our billing staff will not be able to assist you with questions regarding bills from these companies.  You will be contacted with the lab results as soon as they are available. The fastest way to get your results is to activate your My Chart account. Instructions are located on the last page of this paperwork. If you have not heard from [place] regarding the results in 2 weeks, please contact this office.       Health Maintenance, Male Adopting a healthy lifestyle and getting preventive care are important in promoting health and wellness. Ask your health care provider about:  The right schedule for you to have regular tests and exams.  Things you can do on your own to prevent diseases and keep yourself healthy. What should I know about diet, weight, and exercise? Eat a healthy diet   Eat a diet that includes plenty of vegetables, fruits, low-fat dairy products, and lean protein.  Do not eat a lot of foods that are high in solid fats, added sugars, or sodium. Maintain a healthy weight Body mass index (BMI) is a measurement that can be used to identify possible weight problems. It estimates body fat based on height and weight.  Your health care provider can help determine your BMI and help you achieve or maintain a healthy weight. Get regular exercise Get regular exercise. This is one of the most important things you can do for your health. Most adults should:  Exercise for at least 150 minutes each week. The exercise should increase your heart rate and make you sweat (moderate-intensity exercise).  Do strengthening exercises at least twice a week. This is in addition to the moderate-intensity exercise.  Spend less time sitting. Even light physical activity can be beneficial. Watch cholesterol and blood lipids Have your blood tested for lipids and cholesterol at 45 years of age, then have this test every 5 years. You may need to have your cholesterol levels checked more often if:  Your lipid or cholesterol levels are high.  You are older than 45 years of age.  You are at high risk for heart disease. What should I know about cancer screening? Many types of cancers can be detected early and may often be prevented. Depending on your health history and family history, you may need to have cancer screening at various ages. This may include screening for:  Colorectal cancer.  Prostate cancer.  Skin cancer.  Lung cancer. What should I know about heart disease, diabetes, and high blood pressure? Blood pressure and heart disease  High blood pressure causes heart disease and increases the risk of stroke. This is more likely to develop in people who have high blood pressure readings, are of African descent, or are overweight.  Talk with your health care provider about your target blood pressure readings.  Have your blood pressure checked: ? Every 3-5 years if you are 29-69 years of age. ? Every year if you are 54 years old or older.  If you are between the  ages of 22 and 31 and are a current or former smoker, ask your health care provider if you should have a one-time screening for abdominal aortic aneurysm  (AAA). Diabetes Have regular diabetes screenings. This checks your fasting blood sugar level. Have the screening done:  Once every three years after age 77 if you are at a normal weight and have a low risk for diabetes.  More often and at a younger age if you are overweight or have a high risk for diabetes. What should I know about preventing infection? Hepatitis B If you have a higher risk for hepatitis B, you should be screened for this virus. Talk with your health care provider to find out if you are at risk for hepatitis B infection. Hepatitis C Blood testing is recommended for:  Everyone born from [birth year] through 1965.  Anyone with known risk factors for hepatitis C. Sexually transmitted infections (STIs)  You should be screened each year for STIs, including gonorrhea and chlamydia, if: ? You are sexually active and are younger than 45 years of age. ? You are older than 45 years of age and your health care provider tells you that you are at risk for this type of infection. ? Your sexual activity has changed since you were last screened, and you are at increased risk for chlamydia or gonorrhea. Ask your health care provider if you are at risk.  Ask your health care provider about whether you are at high risk for HIV. Your health care provider may recommend a prescription medicine to help prevent HIV infection. If you choose to take medicine to prevent HIV, you should first get tested for HIV. You should then be tested every 3 months for as long as you are taking the medicine. Follow these instructions at home: Lifestyle  Do not use any products that contain nicotine or tobacco, such as cigarettes, e-cigarettes, and chewing tobacco. If you need help quitting, ask your health care provider.  Do not use street drugs.  Do not share needles.  Ask your health care provider for help if you need support or information about quitting drugs. Alcohol use  Do not drink alcohol if your health  care provider tells you not to drink.  If you drink alcohol: ? Limit how much you have to 0-2 drinks a day. ? Be aware of how much alcohol is in your drink. In the [place] one drink equals one 12 oz bottle of beer (355 mL), one 5 oz glass of wine (148 mL), or one 1 oz glass of hard liquor (44 mL). General instructions  Schedule regular health, dental, and eye exams.  Stay current with your vaccines.  Tell your health care provider if: ? You often feel depressed. ? You have ever been abused or do not feel safe at home. Summary  Adopting a healthy lifestyle and getting preventive care are important in promoting health and wellness.  Follow your health care provider's instructions about healthy diet, exercising, and getting tested or screened for diseases.  Follow your health care provider's instructions on monitoring your cholesterol and blood pressure. This information is not intended to replace advice given to you by your health care provider. Make sure you discuss any questions you have with your health care provider. Document Revised: 10/03/2018 Document Reviewed: 10/03/2018 Elsevier Patient Education  2020 Elsevier Inc.      Edwina Barth, MD [facility]

## 2020-06-03 NOTE — Addendum Note (Signed)
Addended by: Lisbeth Renshaw, Ryli Standlee HUA on: 06/03/2020 05:26 PM   Modules accepted: Orders

## 2020-06-03 NOTE — Patient Instructions (Addendum)
   If you have lab work done today you will be contacted with your lab results within the next 2 weeks.  If you have not heard from us then please contact us. The fastest way to get your results is to register for My Chart.   IF you received an x-ray today, you will receive an invoice from [facility]. Please contact [facility] at [phone] with questions or concerns regarding your invoice.   IF you received labwork today, you will receive an invoice from [facility]. Please contact [facility] at [phone] with questions or concerns regarding your invoice.   Our billing staff will not be able to assist you with questions regarding bills from these companies.  You will be contacted with the lab results as soon as they are available. The fastest way to get your results is to activate your My Chart account. Instructions are located on the last page of this paperwork. If you have not heard from us regarding the results in 2 weeks, please contact this office.      Health Maintenance, Male Adopting a healthy lifestyle and getting preventive care are important in promoting health and wellness. Ask your health care provider about:  The right schedule for you to have regular tests and exams.  Things you can do on your own to prevent diseases and keep yourself healthy. What should I know about diet, weight, and exercise? Eat a healthy diet   Eat a diet that includes plenty of vegetables, fruits, low-fat dairy products, and lean protein.  Do not eat a lot of foods that are high in solid fats, added sugars, or sodium. Maintain a healthy weight Body mass index (BMI) is a measurement that can be used to identify possible weight problems. It estimates body fat based on height and weight. Your health care provider can help determine your BMI and help you achieve or maintain a healthy weight. Get regular exercise Get regular exercise. This is one of the most important things you  can do for your health. Most adults should:  Exercise for at least 150 minutes each week. The exercise should increase your heart rate and make you sweat (moderate-intensity exercise).  Do strengthening exercises at least twice a week. This is in addition to the moderate-intensity exercise.  Spend less time sitting. Even light physical activity can be beneficial. Watch cholesterol and blood lipids Have your blood tested for lipids and cholesterol at 45 years of age, then have this test every 5 years. You may need to have your cholesterol levels checked more often if:  Your lipid or cholesterol levels are high.  You are older than 45 years of age.  You are at high risk for heart disease. What should I know about cancer screening? Many types of cancers can be detected early and may often be prevented. Depending on your health history and family history, you may need to have cancer screening at various ages. This may include screening for:  Colorectal cancer.  Prostate cancer.  Skin cancer.  Lung cancer. What should I know about heart disease, diabetes, and high blood pressure? Blood pressure and heart disease  High blood pressure causes heart disease and increases the risk of stroke. This is more likely to develop in people who have high blood pressure readings, are of African descent, or are overweight.  Talk with your health care provider about your target blood pressure readings.  Have your blood pressure checked: ? Every 3-5 years if you are 45-39   years of age. ? Every year if you are 40 years old or older.  If you are between the ages of 65 and 75 and are a current or former smoker, ask your health care provider if you should have a one-time screening for abdominal aortic aneurysm (AAA). Diabetes Have regular diabetes screenings. This checks your fasting blood sugar level. Have the screening done:  Once every three years after age 45 if you are at a normal weight and have  a low risk for diabetes.  More often and at a younger age if you are overweight or have a high risk for diabetes. What should I know about preventing infection? Hepatitis B If you have a higher risk for hepatitis B, you should be screened for this virus. Talk with your health care provider to find out if you are at risk for hepatitis B infection. Hepatitis C Blood testing is recommended for:  Everyone born from 1945 through 1965.  Anyone with known risk factors for hepatitis C. Sexually transmitted infections (STIs)  You should be screened each year for STIs, including gonorrhea and chlamydia, if: ? You are sexually active and are younger than 45 years of age. ? You are older than 45 years of age and your health care provider tells you that you are at risk for this type of infection. ? Your sexual activity has changed since you were last screened, and you are at increased risk for chlamydia or gonorrhea. Ask your health care provider if you are at risk.  Ask your health care provider about whether you are at high risk for HIV. Your health care provider may recommend a prescription medicine to help prevent HIV infection. If you choose to take medicine to prevent HIV, you should first get tested for HIV. You should then be tested every 3 months for as long as you are taking the medicine. Follow these instructions at home: Lifestyle  Do not use any products that contain nicotine or tobacco, such as cigarettes, e-cigarettes, and chewing tobacco. If you need help quitting, ask your health care provider.  Do not use street drugs.  Do not share needles.  Ask your health care provider for help if you need support or information about quitting drugs. Alcohol use  Do not drink alcohol if your health care provider tells you not to drink.  If you drink alcohol: ? Limit how much you have to 0-2 drinks a day. ? Be aware of how much alcohol is in your drink. In the [place] one drink equals one 12  oz bottle of beer (355 mL), one 5 oz glass of wine (148 mL), or one 1 oz glass of hard liquor (44 mL). General instructions  Schedule regular health, dental, and eye exams.  Stay current with your vaccines.  Tell your health care provider if: ? You often feel depressed. ? You have ever been abused or do not feel safe at home. Summary  Adopting a healthy lifestyle and getting preventive care are important in promoting health and wellness.  Follow your health care provider's instructions about healthy diet, exercising, and getting tested or screened for diseases.  Follow your health care provider's instructions on monitoring your cholesterol and blood pressure. This information is not intended to replace advice given to you by your health care provider. Make sure you discuss any questions you have with your health care provider. Document Revised: 10/03/2018 Document Reviewed: 10/03/2018 Elsevier Patient Education  2020 Elsevier Inc.  

## 2020-06-03 NOTE — Progress Notes (Signed)
The 10-year ASCVD risk score Denman George DC Montez Hageman., et al., 2013) is: 13.5%   Values used to calculate the score:     Age: 44 years     Sex: Male     Is Non-Hispanic African American: Yes     Diabetic: No     Tobacco smoker: Yes     Systolic Blood Pressure: 140 mmHg     Is BP treated: Yes     HDL Cholesterol: 40 mg/dL     Total Cholesterol: 189 mg/dL Wt Readings from Last 3 Encounters:  06/03/20 246 lb (111.6 kg)  11/09/18 221 lb (100.2 kg)  09/14/18 222 lb 3.2 oz (100.8 kg)

## 2020-06-04 LAB — CBC WITH DIFFERENTIAL/PLATELET
Basophils Absolute: 0 10*3/uL (ref 0.0–0.2)
Basos: 0 %
EOS (ABSOLUTE): 0.3 10*3/uL (ref 0.0–0.4)
Eos: 4 %
Hematocrit: 44.1 % (ref 37.5–51.0)
Hemoglobin: 15.1 g/dL (ref 13.0–17.7)
Immature Grans (Abs): 0 10*3/uL (ref 0.0–0.1)
Immature Granulocytes: 0 %
Lymphocytes Absolute: 1.8 10*3/uL (ref 0.7–3.1)
Lymphs: 22 %
MCH: 28.7 pg (ref 26.6–33.0)
MCHC: 34.2 g/dL (ref 31.5–35.7)
MCV: 84 fL (ref 79–97)
Monocytes Absolute: 0.5 10*3/uL (ref 0.1–0.9)
Monocytes: 7 %
Neutrophils Absolute: 5.5 10*3/uL (ref 1.4–7.0)
Neutrophils: 67 %
Platelets: 317 10*3/uL (ref 150–450)
RBC: 5.27 x10E6/uL (ref 4.14–5.80)
RDW: 13.2 % (ref 11.6–15.4)
WBC: 8.3 10*3/uL (ref 3.4–10.8)

## 2020-06-04 LAB — COMPREHENSIVE METABOLIC PANEL
ALT: 39 IU/L (ref 0–44)
AST: 37 IU/L (ref 0–40)
Albumin/Globulin Ratio: 1.4 (ref 1.2–2.2)
Albumin: 4.5 g/dL (ref 4.0–5.0)
Alkaline Phosphatase: 129 IU/L — ABNORMAL HIGH (ref 48–121)
BUN/Creatinine Ratio: 11 (ref 9–20)
BUN: 12 mg/dL (ref 6–24)
Bilirubin Total: 0.5 mg/dL (ref 0.0–1.2)
CO2: 23 mmol/L (ref 20–29)
Calcium: 9.5 mg/dL (ref 8.7–10.2)
Chloride: 102 mmol/L (ref 96–106)
Creatinine, Ser: 1.08 mg/dL (ref 0.76–1.27)
GFR calc Af Amer: 96 mL/min/{1.73_m2} (ref >59)
GFR calc non Af Amer: 83 mL/min/{1.73_m2} (ref >59)
Globulin, Total: 3.2 g/dL (ref 1.5–4.5)
Glucose: 116 mg/dL — ABNORMAL HIGH (ref 65–99)
Potassium: 3.9 mmol/L (ref 3.5–5.2)
Sodium: 139 mmol/L (ref 134–144)
Total Protein: 7.7 g/dL (ref 6.0–8.5)

## 2020-06-04 LAB — LIPID PANEL
Chol/HDL Ratio: 3.9 ratio (ref 0.0–5.0)
Cholesterol, Total: 196 mg/dL (ref 100–199)
HDL: 50 mg/dL (ref >39)
LDL Chol Calc (NIH): 129 mg/dL — ABNORMAL HIGH (ref 0–99)
Triglycerides: 91 mg/dL (ref 0–149)
VLDL Cholesterol Cal: 17 mg/dL (ref 5–40)

## 2020-08-27 IMAGING — CT CT ABD-PELV W/ CM
2 of 5 series · 17 of 46 positions shown, 19 images · IV contrast (iopamidol)
Comparison: None.

CLINICAL DATA: Lower abdominal pain.

EXAM:
CT ABDOMEN AND PELVIS WITH CONTRAST
TECHNIQUE: Multidetector CT imaging of the abdomen and pelvis was performed
using the standard protocol following bolus administration of
intravenous contrast.
CONTRAST:  100mL INVJKI-PII IOPAMIDOL (INVJKI-PII) INJECTION 61%

[Series 2: axial st · axial · 0.81mm/px · z∈[+625,+1050]mm · 14 of 95 slices shown, 16 images]
[im 5/95  soft-tissue]
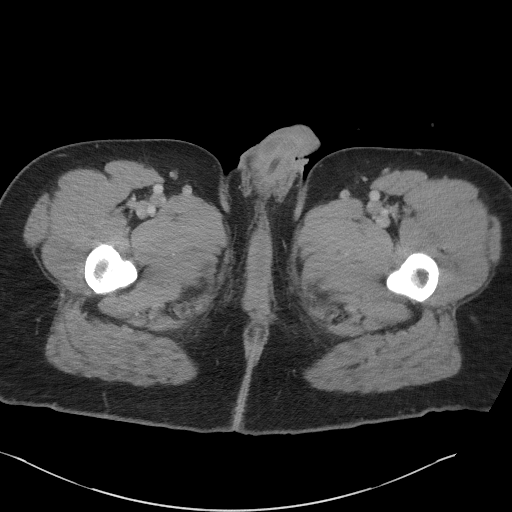
[im 5/95  bone]
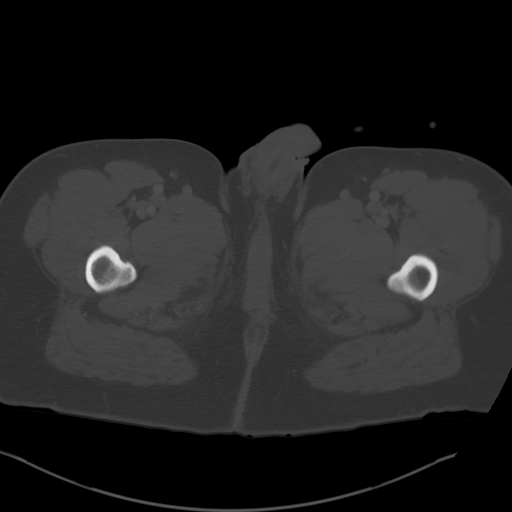
[im 10/95  soft-tissue]
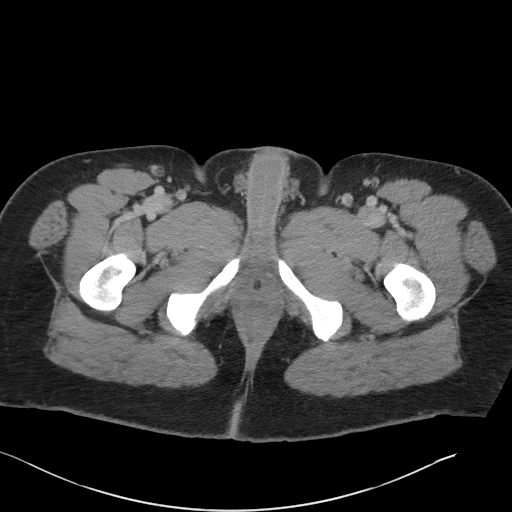
[im 20/95  soft-tissue]
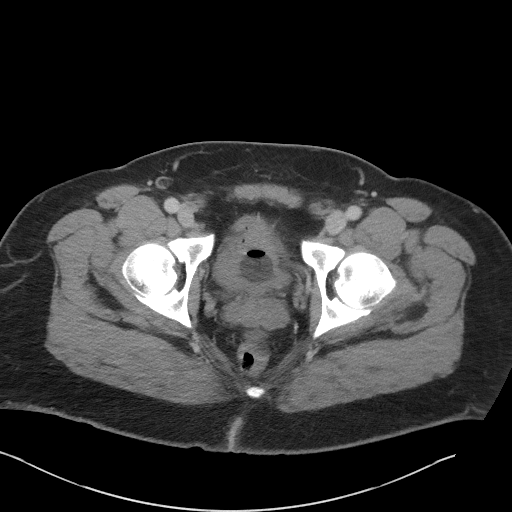
[im 25/95  soft-tissue]
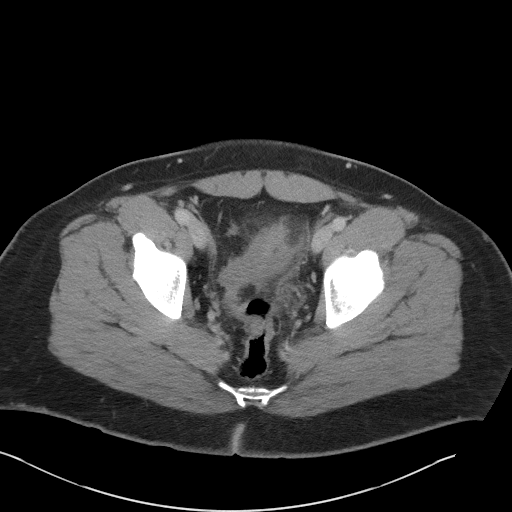
[im 30/95  soft-tissue]
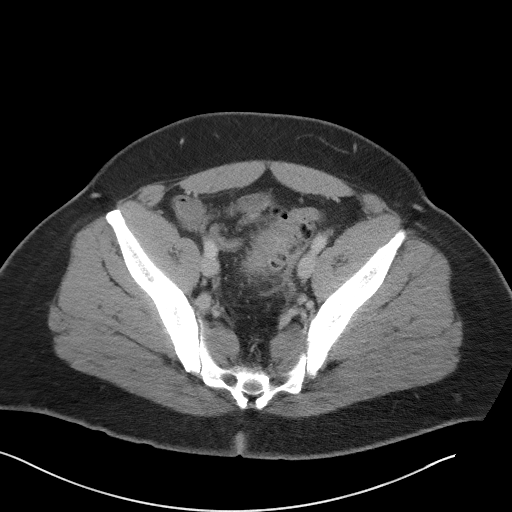
[im 40/95  soft-tissue]
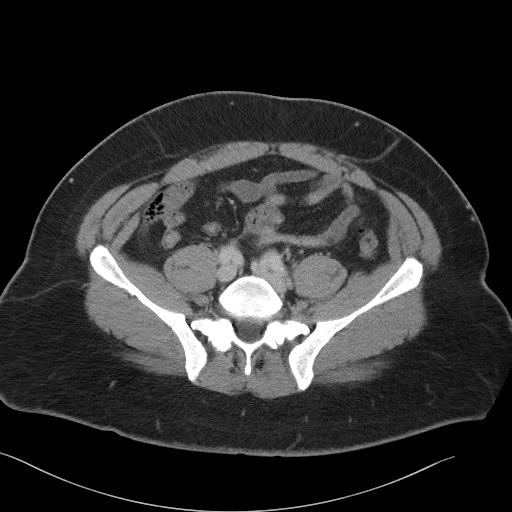
[im 45/95  soft-tissue]
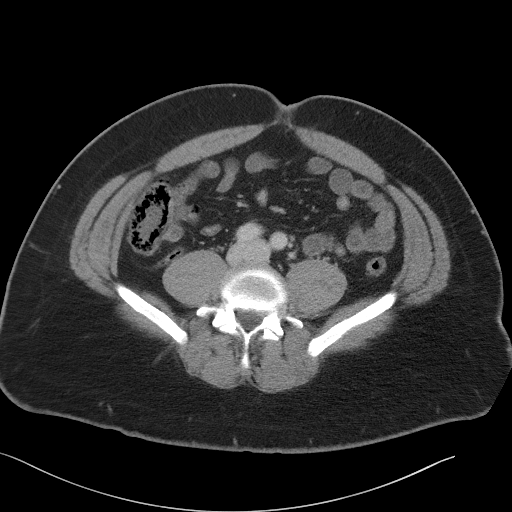
[im 50/95  soft-tissue]
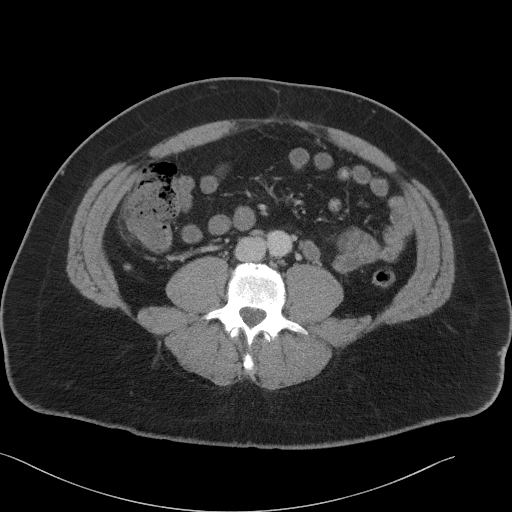
[im 55/95  soft-tissue]
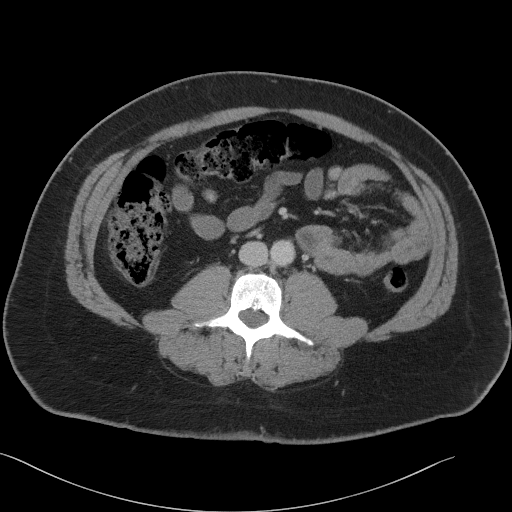
[im 55/95  bone]
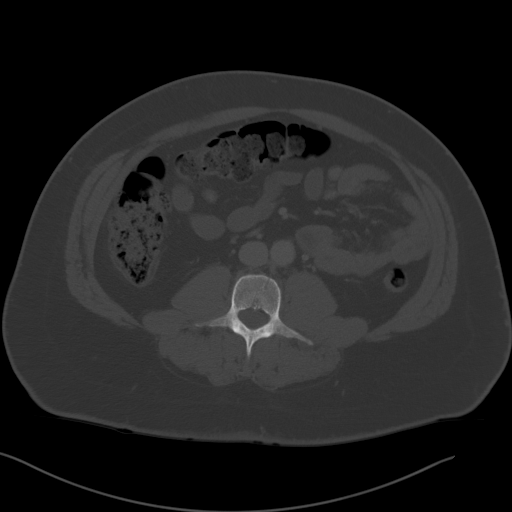
[im 65/95  soft-tissue]
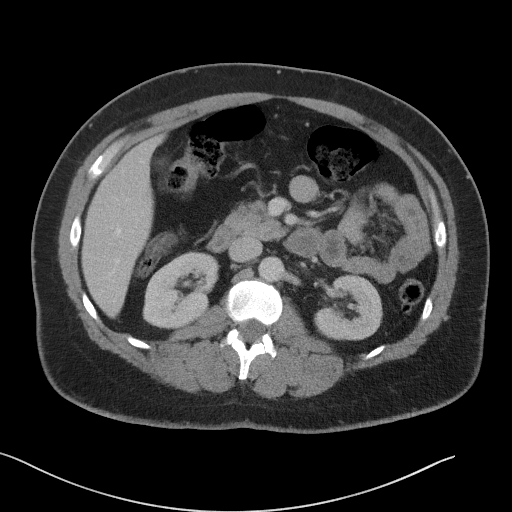
[im 70/95  soft-tissue]
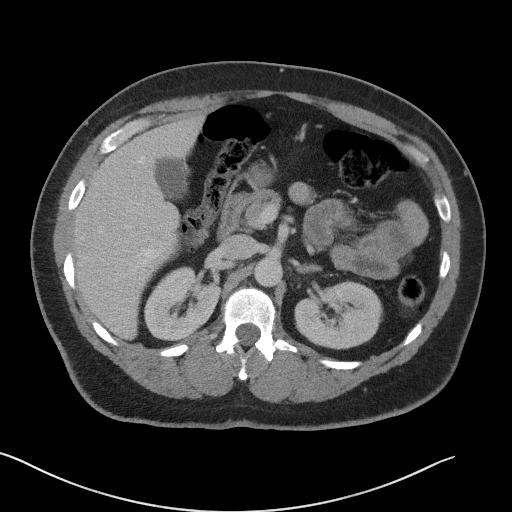
[im 75/95  soft-tissue]
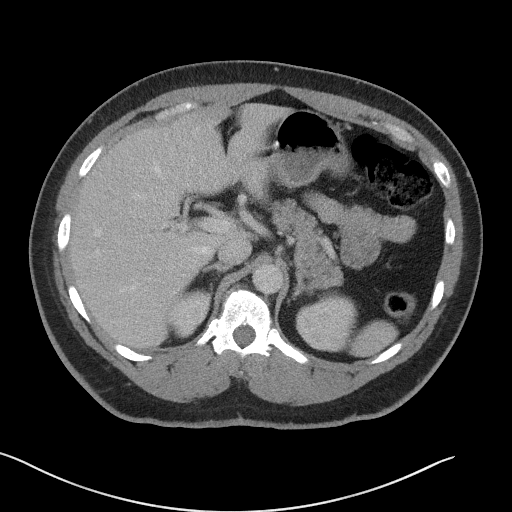
[im 85/95  soft-tissue]
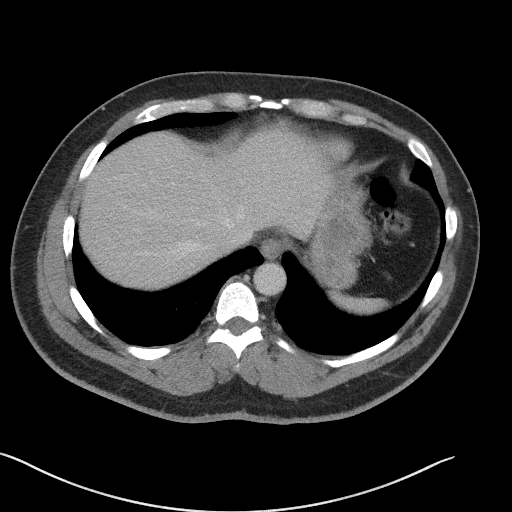
[im 90/95  soft-tissue]
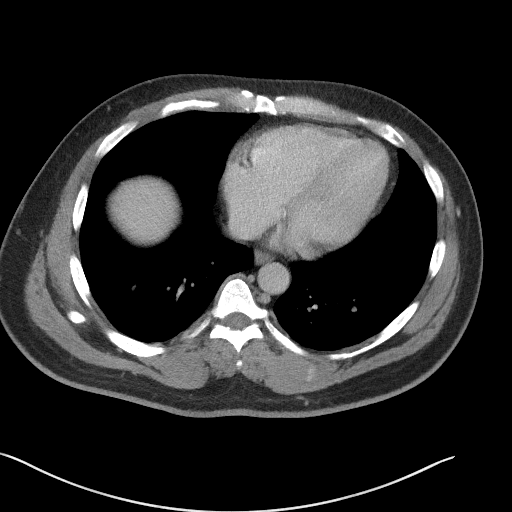

[Series 5: coronal st · coronal · 0.89mm/px · 3 of 116 slices shown]
[im 39/116  soft-tissue]
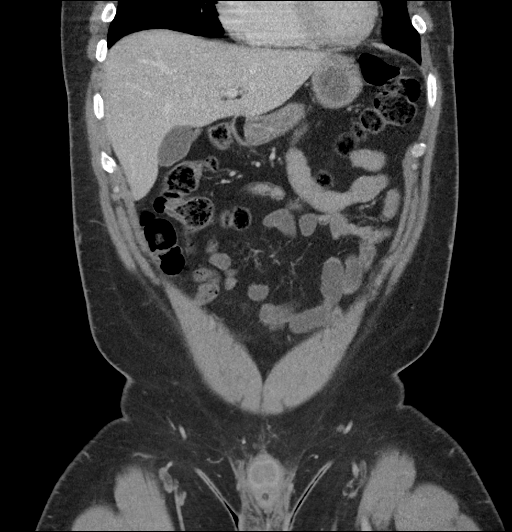
[im 52/116  soft-tissue]
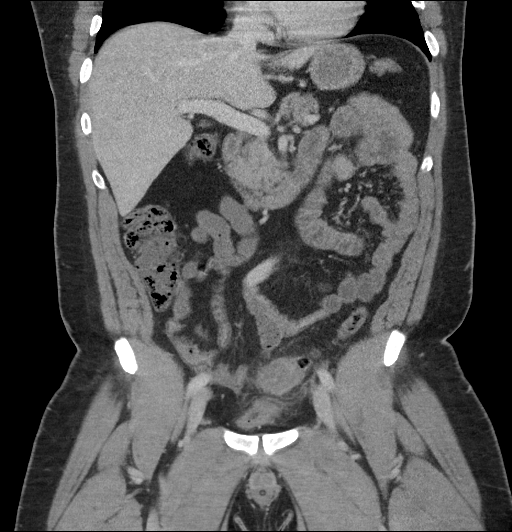
[im 64/116  soft-tissue]
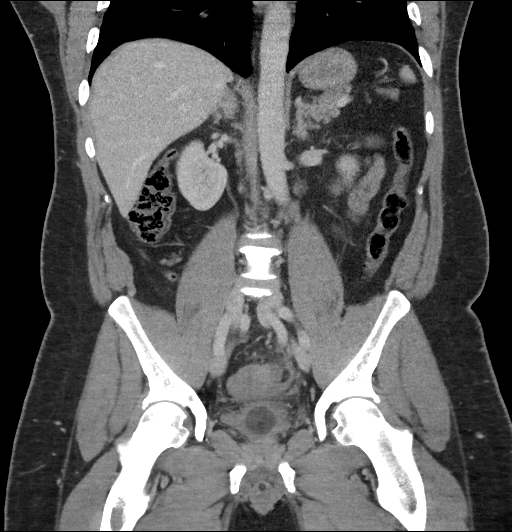

[17 of 46 positions shown; findings below may reference images not displayed]

FINDINGS: Lower chest: Lung bases are clear. No effusions. Heart is normal
size.

Hepatobiliary: No focal hepatic abnormality. Gallbladder
unremarkable.

Pancreas: No focal abnormality or ductal dilatation.

Spleen: No focal abnormality.  Normal size.

Adrenals/Urinary Tract: No adrenal abnormality. No focal renal
abnormality. No stones or hydronephrosis. Urinary bladder is
decompressed with Foley catheter in place.

Stomach/Bowel: Normal appendix. There is inflammatory stranding
around the mid sigmoid colon with sigmoid diverticulosis. Findings
compatible with active diverticulitis. No evidence of bowel
obstruction.

Vascular/Lymphatic: No evidence of aneurysm or adenopathy.

Reproductive: No visible focal abnormality.

Other: No free fluid or free air.

Musculoskeletal: No acute bony abnormality.
IMPRESSION: Sigmoid diverticulosis with inflammatory stranding around the
sigmoid colon compatible with active diverticulitis. On

## 2020-12-02 ENCOUNTER — Ambulatory Visit: Payer: 59 | Admitting: Emergency Medicine

## 2020-12-03 ENCOUNTER — Encounter: Payer: Self-pay | Admitting: Emergency Medicine

## 2021-01-07 ENCOUNTER — Telehealth: Payer: Self-pay | Admitting: Emergency Medicine

## 2021-01-07 NOTE — Telephone Encounter (Signed)
Called left a message for pt. He was not diagnosed with [place] he was treated for an acute diverticulitis episode in 08/2018 and GI would have diagnosed him after that as we did refer him at that appointment. If more questions would recommend calling GI

## 2021-01-07 NOTE — Telephone Encounter (Signed)
Patient wants to know date of when he was diagnosed with diverticulitis. Information needed for his job. Patient requesting for information to be left on his voicemail if he doesn't answer. Please advise at [phone] asap.

## 2021-06-17 ENCOUNTER — Other Ambulatory Visit: Payer: Self-pay | Admitting: Emergency Medicine

## 2021-06-17 DIAGNOSIS — I10 Essential (primary) hypertension: Secondary | ICD-10-CM

## 2021-09-14 ENCOUNTER — Encounter: Payer: Self-pay | Admitting: Emergency Medicine

## 2021-09-17 ENCOUNTER — Other Ambulatory Visit: Payer: Self-pay | Admitting: Emergency Medicine

## 2021-09-17 DIAGNOSIS — I10 Essential (primary) hypertension: Secondary | ICD-10-CM

## 2021-09-22 NOTE — Telephone Encounter (Signed)
Called and left vm to call back

## 2021-12-16 ENCOUNTER — Encounter: Payer: Self-pay | Admitting: Emergency Medicine

## 2021-12-27 ENCOUNTER — Other Ambulatory Visit: Payer: Self-pay | Admitting: Emergency Medicine

## 2021-12-27 DIAGNOSIS — I10 Essential (primary) hypertension: Secondary | ICD-10-CM

## 2021-12-27 MED ORDER — AMLODIPINE BESYLATE 10 MG PO TABS: ORAL_TABLET | ORAL | 0 refills | Status: DC

## 2022-04-04 ENCOUNTER — Encounter: Payer: Self-pay | Admitting: Emergency Medicine

## 2022-04-04 ENCOUNTER — Other Ambulatory Visit: Payer: Self-pay | Admitting: Emergency Medicine

## 2022-04-04 ENCOUNTER — Ambulatory Visit (INDEPENDENT_AMBULATORY_CARE_PROVIDER_SITE_OTHER): Payer: 59 | Admitting: Emergency Medicine

## 2022-04-04 VITALS — BP 130/70 | HR 86 | Temp 99.0°F | Ht 65.0 in | Wt 230.2 lb

## 2022-04-04 DIAGNOSIS — E785 Hyperlipidemia, unspecified: Secondary | ICD-10-CM

## 2022-04-04 DIAGNOSIS — R7303 Prediabetes: Secondary | ICD-10-CM | POA: Diagnosis not present

## 2022-04-04 DIAGNOSIS — F17209 Nicotine dependence, unspecified, with unspecified nicotine-induced disorders: Secondary | ICD-10-CM

## 2022-04-04 DIAGNOSIS — I10 Essential (primary) hypertension: Secondary | ICD-10-CM | POA: Diagnosis not present

## 2022-04-04 DIAGNOSIS — N1831 Chronic kidney disease, stage 3a: Secondary | ICD-10-CM

## 2022-04-04 DIAGNOSIS — R5383 Other fatigue: Secondary | ICD-10-CM | POA: Insufficient documentation

## 2022-04-04 LAB — LDL CHOLESTEROL, DIRECT: Direct LDL: 132 mg/dL

## 2022-04-04 LAB — CBC WITH DIFFERENTIAL/PLATELET
Basophils Absolute: 0.1 10*3/uL (ref 0.0–0.1)
Basophils Relative: 0.4 % (ref 0.0–3.0)
Eosinophils Absolute: 0.3 10*3/uL (ref 0.0–0.7)
Eosinophils Relative: 2.5 % (ref 0.0–5.0)
HCT: 43.3 % (ref 39.0–52.0)
Hemoglobin: 14.6 g/dL (ref 13.0–17.0)
Lymphocytes Relative: 22.8 % (ref 12.0–46.0)
Lymphs Abs: 2.8 10*3/uL (ref 0.7–4.0)
MCHC: 33.6 g/dL (ref 30.0–36.0)
MCV: 88.5 fl (ref 78.0–100.0)
Monocytes Absolute: 1 10*3/uL (ref 0.1–1.0)
Monocytes Relative: 8.1 % (ref 3.0–12.0)
Neutro Abs: 8.2 10*3/uL — ABNORMAL HIGH (ref 1.4–7.7)
Neutrophils Relative %: 66.2 % (ref 43.0–77.0)
Platelets: 310 10*3/uL (ref 150.0–400.0)
RBC: 4.9 Mil/uL (ref 4.22–5.81)
RDW: 14.6 % (ref 11.5–15.5)
WBC: 12.4 10*3/uL — ABNORMAL HIGH (ref 4.0–10.5)

## 2022-04-04 LAB — LIPID PANEL
Cholesterol: 204 mg/dL — ABNORMAL HIGH (ref 0–200)
HDL: 44.8 mg/dL (ref >39.00)
NonHDL: 158.8
Total CHOL/HDL Ratio: 5
Triglycerides: 304 mg/dL — ABNORMAL HIGH (ref 0.0–149.0)
VLDL: 60.8 mg/dL — ABNORMAL HIGH (ref 0.0–40.0)

## 2022-04-04 LAB — COMPREHENSIVE METABOLIC PANEL
ALT: 26 U/L (ref 0–53)
AST: 24 U/L (ref 0–37)
Albumin: 4.3 g/dL (ref 3.5–5.2)
Alkaline Phosphatase: 98 U/L (ref 39–117)
BUN: 19 mg/dL (ref 6–23)
CO2: 25 mEq/L (ref 19–32)
Calcium: 9.8 mg/dL (ref 8.4–10.5)
Chloride: 103 mEq/L (ref 96–112)
Creatinine, Ser: 1.57 mg/dL — ABNORMAL HIGH (ref 0.40–1.50)
GFR: 52.45 mL/min — ABNORMAL LOW (ref >60.00)
Glucose, Bld: 93 mg/dL (ref 70–99)
Potassium: 3.6 mEq/L (ref 3.5–5.1)
Sodium: 138 mEq/L (ref 135–145)
Total Bilirubin: 0.6 mg/dL (ref 0.2–1.2)
Total Protein: 7.6 g/dL (ref 6.0–8.3)

## 2022-04-04 MED ORDER — ROSUVASTATIN CALCIUM 20 MG PO TABS: 20.0000 mg | ORAL_TABLET | Freq: Every day | ORAL | 3 refills | Status: AC

## 2022-04-04 MED ORDER — AMLODIPINE BESYLATE 10 MG PO TABS: ORAL_TABLET | ORAL | 0 refills | Status: DC

## 2022-04-04 NOTE — Assessment & Plan Note (Signed)
Diet and nutrition discussed.  Blood work done today. Advised to decrease amount of daily carbohydrate intake and daily calories. Blood work done today.

## 2022-04-04 NOTE — Progress Notes (Signed)
Paul [name] 46 y.o.   Chief Complaint  Patient presents with   Annual Exam   Fatigue   Weight Gain    HISTORY OF PRESENT ILLNESS: This is a 46 y.o. male with history of hypertension, dyslipidemia and prediabetes, last office visit August 2021 here for follow-up. Eating better and losing weight.  Occasional fatigue.  Questions about testosterone. Otherwise doing well.  No other complaints or medical concerns today.  HPI   Prior to Admission medications   Medication Sig Start Date End Date Taking? Authorizing Provider  amLODipine (NORVASC) 10 MG tablet TAKE 1 TABLET BY MOUTH EVERY DAY 12/27/21  Yes Sherma Vanmetre, Eilleen Kempf, MD  rosuvastatin (CRESTOR) 20 MG tablet Take 1 tablet (20 mg total) by mouth daily. 06/03/20  Yes Risa Auman, Eilleen Kempf, MD  polyethylene glycol [facility]) packet Take 17 g by mouth daily. Patient not taking: Reported on 04/04/2022 09/08/18   Benjiman Core, MD    No Known Allergies  Patient Active Problem List   Diagnosis Date Noted   Morbid (severe) obesity due to excess calories (HCC) 06/03/2020   Prediabetes 06/03/2020   Dyslipidemia 06/03/2020   Tobacco use disorder, continuous 06/03/2020   Elevated cholesterol 06/01/2018   HTN (hypertension) 05/21/2012   Nicotine addiction 05/21/2012    Past Medical History:  Diagnosis Date   Hypertension     Past Surgical History:  Procedure Laterality Date   FRACTURE SURGERY     MANDIBLE SURGERY      Social History   Socioeconomic History   Marital status: Married    Spouse name: Not on file   Number of children: Not on file   Years of education: Not on file   Highest education level: Not on file  Occupational History   Not on file  Tobacco Use   Smoking status: Light Smoker    Types: Cigarettes   Smokeless tobacco: Never  Vaping Use   Vaping Use: Never used  Substance and Sexual Activity   Alcohol use: Yes    Alcohol/week: 4.0 standard drinks of alcohol    Types: 4 Standard drinks or  equivalent per week    Comment: socially   Drug use: No   Sexual activity: Yes  Other Topics Concern   Not on file  Social History Narrative   Not on file   Social Determinants of Health   Financial Resource Strain: Not on file  Food Insecurity: Not on file  Transportation Needs: Not on file  Physical Activity: Not on file  Stress: Not on file  Social Connections: Not on file  Intimate Partner Violence: Not on file    Family History  Problem Relation Age of Onset   Lung cancer Mother    Breast cancer Mother    Cancer Mother    Cancer Father    Colon cancer Neg Hx    Rectal cancer Neg Hx      Review of Systems  Constitutional:  Positive for malaise/fatigue. Negative for chills and fever.  HENT: Negative.  Negative for congestion and sore throat.   Respiratory: Negative.  Negative for cough and shortness of breath.   Cardiovascular: Negative.  Negative for chest pain and palpitations.  Gastrointestinal:  Negative for abdominal pain, diarrhea, nausea and vomiting.  Genitourinary: Negative.  Negative for dysuria and hematuria.  Skin: Negative.  Negative for rash.  Neurological: Negative.  Negative for dizziness and headaches.  All other systems reviewed and are negative.  Today's Vitals   04/04/22 0910 04/04/22 0914  BP: Marland Kitchen)  136/92 134/90  Pulse: 86   Temp: 99 F (37.2 C)   TempSrc: Oral   SpO2: 97%   Weight: 230 lb 4 oz (104.4 kg)   Height:  (1.651 m)    Body mass index is 38.32 kg/m.   Physical Exam Vitals reviewed.  Constitutional:      Appearance: Normal appearance.  HENT:     Head: Normocephalic.     Right Ear: Tympanic membrane, ear canal and external ear normal.     Left Ear: Tympanic membrane, ear canal and external ear normal.     Mouth/Throat:     Mouth: Mucous membranes are moist.     Pharynx: Oropharynx is clear.  Eyes:     Extraocular Movements: Extraocular movements intact.     Conjunctiva/sclera: Conjunctivae normal.     Pupils:  Pupils are equal, round, and reactive to light.  Cardiovascular:     Rate and Rhythm: Normal rate and regular rhythm.     Pulses: Normal pulses.     Heart sounds: Normal heart sounds.  Pulmonary:     Effort: Pulmonary effort is normal.     Breath sounds: Normal breath sounds.  Abdominal:     General: There is no distension.     Palpations: Abdomen is soft.     Tenderness: There is no abdominal tenderness.  Musculoskeletal:     Cervical back: No tenderness.     Right lower leg: No edema.     Left lower leg: No edema.  Lymphadenopathy:     Cervical: No cervical adenopathy.  Skin:    General: Skin is warm and dry.     Capillary Refill: Capillary refill takes less than 2 seconds.  Neurological:     General: No focal deficit present.     Mental Status: He is alert and oriented to person, place, and time.  Psychiatric:        Mood and Affect: Mood normal.        Behavior: Behavior normal.      ASSESSMENT & PLAN: A total of 50 minutes was spent with the patient and counseling/coordination of care regarding preparing for this visit, review of most recent office visit notes, review of most recent blood work results, review of multiple chronic medical problems and their management, education on nutrition, prognosis, documentation, review of health maintenance items, and need for follow-up.  Problem List Items Addressed This Visit       Cardiovascular and Mediastinum   HTN (hypertension) - Primary    Is slightly elevated readings in the office but normal blood pressure readings at home.  Continue amlodipine 10 mg daily. Cardiovascular risks associated with hypertension discussed. Dietary approaches to stop hypertension discussed. Follow-up in 6 months.      Relevant Medications   rosuvastatin (CRESTOR) 20 MG tablet   amLODipine (NORVASC) 10 MG tablet     Other   Prediabetes    Diet and nutrition discussed.  Blood work done today. Advised to decrease amount of daily  carbohydrate intake and daily calories. Blood work done today.      Relevant Orders   Hemoglobin A1c   Dyslipidemia   Relevant Medications   rosuvastatin (CRESTOR) 20 MG tablet   Other Relevant Orders   CBC with Differential/Platelet   Comprehensive metabolic panel   Lipid panel   Tobacco use disorder, continuous    Cardiovascular and cancer risks associated with smoking discussed.  Smoking cessation advice given.  Patient is smoking a lot less these days.  Fatigue    Multifactorial.  Differential diagnosis discussed.  Need for blood work today.      Relevant Orders   CBC with Differential/Platelet   TestT+TestF+SHBG   Other Visit Diagnoses     Essential hypertension       Relevant Medications   rosuvastatin (CRESTOR) 20 MG tablet   amLODipine (NORVASC) 10 MG tablet   Other Relevant Orders   CBC with Differential/Platelet   Comprehensive metabolic panel      Patient Instructions  Health Maintenance, Male Adopting a healthy lifestyle and getting preventive care are important in promoting health and wellness. Ask your health care provider about: The right schedule for you to have regular tests and exams. Things you can do on your own to prevent diseases and keep yourself healthy. What should I know about diet, weight, and exercise? Eat a healthy diet  Eat a diet that includes plenty of vegetables, fruits, low-fat dairy products, and lean protein. Do not eat a lot of foods that are high in solid fats, added sugars, or sodium. Maintain a healthy weight Body mass index (BMI) is a measurement that can be used to identify possible weight problems. It estimates body fat based on height and weight. Your health care provider can help determine your BMI and help you achieve or maintain a healthy weight. Get regular exercise Get regular exercise. This is one of the most important things you can do for your health. Most adults should: Exercise for at least 150 minutes each  week. The exercise should increase your heart rate and make you sweat (moderate-intensity exercise). Do strengthening exercises at least twice a week. This is in addition to the moderate-intensity exercise. Spend less time sitting. Even light physical activity can be beneficial. Watch cholesterol and blood lipids Have your blood tested for lipids and cholesterol at 47 years of age, then have this test every 5 years. You may need to have your cholesterol levels checked more often if: Your lipid or cholesterol levels are high. You are older than 47 years of age. You are at high risk for heart disease. What should I know about cancer screening? Many types of cancers can be detected early and may often be prevented. Depending on your health history and family history, you may need to have cancer screening at various ages. This may include screening for: Colorectal cancer. Prostate cancer. Skin cancer. Lung cancer. What should I know about heart disease, diabetes, and high blood pressure? Blood pressure and heart disease High blood pressure causes heart disease and increases the risk of stroke. This is more likely to develop in people who have high blood pressure readings or are overweight. Talk with your health care provider about your target blood pressure readings. Have your blood pressure checked: Every 3-5 years if you are 71-7 years of age. Every year if you are 50 years old or older. If you are between the ages of 63 and 64 and are a current or former smoker, ask your health care provider if you should have a one-time screening for abdominal aortic aneurysm (AAA). Diabetes Have regular diabetes screenings. This checks your fasting blood sugar level. Have the screening done: Once every three years after age 68 if you are at a normal weight and have a low risk for diabetes. More often and at a younger age if you are overweight or have a high risk for diabetes. What should I know about  preventing infection? Hepatitis B If you have a higher risk for  hepatitis B, you should be screened for this virus. Talk with your health care provider to find out if you are at risk for hepatitis B infection. Hepatitis C Blood testing is recommended for: Everyone born from [birth year] through 1966. Anyone with known risk factors for hepatitis C. Sexually transmitted infections (STIs) You should be screened each year for STIs, including gonorrhea and chlamydia, if: You are sexually active and are younger than 47 years of age. You are older than 47 years of age and your health care provider tells you that you are at risk for this type of infection. Your sexual activity has changed since you were last screened, and you are at increased risk for chlamydia or gonorrhea. Ask your health care provider if you are at risk. Ask your health care provider about whether you are at high risk for HIV. Your health care provider may recommend a prescription medicine to help prevent HIV infection. If you choose to take medicine to prevent HIV, you should first get tested for HIV. You should then be tested every 3 months for as long as you are taking the medicine. Follow these instructions at home: Alcohol use Do not drink alcohol if your health care provider tells you not to drink. If you drink alcohol: Limit how much you have to 0-2 drinks a day. Know how much alcohol is in your drink. In the [place] one drink equals one 12 oz bottle of beer (355 mL), one 5 oz glass of wine (148 mL), or one 1 oz glass of hard liquor (44 mL). Lifestyle Do not use any products that contain nicotine or tobacco. These products include cigarettes, chewing tobacco, and vaping devices, such as e-cigarettes. If you need help quitting, ask your health care provider. Do not use street drugs. Do not share needles. Ask your health care provider for help if you need support or information about quitting drugs. General instructions Schedule  regular health, dental, and eye exams. Stay current with your vaccines. Tell your health care provider if: You often feel depressed. You have ever been abused or do not feel safe at home. Summary Adopting a healthy lifestyle and getting preventive care are important in promoting health and wellness. Follow your health care provider's instructions about healthy diet, exercising, and getting tested or screened for diseases. Follow your health care provider's instructions on monitoring your cholesterol and blood pressure. This information is not intended to replace advice given to you by your health care provider. Make sure you discuss any questions you have with your health care provider. Document Revised: 03/01/2021 Document Reviewed: 03/01/2021 Elsevier Patient Education  2023 Elsevier Inc.       Edwina Barth, MD [facility]

## 2022-04-04 NOTE — Assessment & Plan Note (Signed)
Is slightly elevated readings in the office but normal blood pressure readings at home.  Continue amlodipine 10 mg daily. Cardiovascular risks associated with hypertension discussed. Dietary approaches to stop hypertension discussed. Follow-up in 6 months.

## 2022-04-04 NOTE — Patient Instructions (Signed)
Health Maintenance, Male Adopting a healthy lifestyle and getting preventive care are important in promoting health and wellness. Ask your health care provider about: The right schedule for you to have regular tests and exams. Things you can do on your own to prevent diseases and keep yourself healthy. What should I know about diet, weight, and exercise? Eat a healthy diet  Eat a diet that includes plenty of vegetables, fruits, low-fat dairy products, and lean protein. Do not eat a lot of foods that are high in solid fats, added sugars, or sodium. Maintain a healthy weight Body mass index (BMI) is a measurement that can be used to identify possible weight problems. It estimates body fat based on height and weight. Your health care provider can help determine your BMI and help you achieve or maintain a healthy weight. Get regular exercise Get regular exercise. This is one of the most important things you can do for your health. Most adults should: Exercise for at least 150 minutes each week. The exercise should increase your heart rate and make you sweat (moderate-intensity exercise). Do strengthening exercises at least twice a week. This is in addition to the moderate-intensity exercise. Spend less time sitting. Even light physical activity can be beneficial. Watch cholesterol and blood lipids Have your blood tested for lipids and cholesterol at 47 years of age, then have this test every 5 years. You may need to have your cholesterol levels checked more often if: Your lipid or cholesterol levels are high. You are older than 47 years of age. You are at high risk for heart disease. What should I know about cancer screening? Many types of cancers can be detected early and may often be prevented. Depending on your health history and family history, you may need to have cancer screening at various ages. This may include screening for: Colorectal cancer. Prostate cancer. Skin cancer. Lung  cancer. What should I know about heart disease, diabetes, and high blood pressure? Blood pressure and heart disease High blood pressure causes heart disease and increases the risk of stroke. This is more likely to develop in people who have high blood pressure readings or are overweight. Talk with your health care provider about your target blood pressure readings. Have your blood pressure checked: Every 3-5 years if you are 18-39 years of age. Every year if you are 40 years old or older. If you are between the ages of 65 and 75 and are a current or former smoker, ask your health care provider if you should have a one-time screening for abdominal aortic aneurysm (AAA). Diabetes Have regular diabetes screenings. This checks your fasting blood sugar level. Have the screening done: Once every three years after age 45 if you are at a normal weight and have a low risk for diabetes. More often and at a younger age if you are overweight or have a high risk for diabetes. What should I know about preventing infection? Hepatitis B If you have a higher risk for hepatitis B, you should be screened for this virus. Talk with your health care provider to find out if you are at risk for hepatitis B infection. Hepatitis C Blood testing is recommended for: Everyone born from 1945 through 1965. Anyone with known risk factors for hepatitis C. Sexually transmitted infections (STIs) You should be screened each year for STIs, including gonorrhea and chlamydia, if: You are sexually active and are younger than 47 years of age. You are older than 47 years of age and your   health care provider tells you that you are at risk for this type of infection. Your sexual activity has changed since you were last screened, and you are at increased risk for chlamydia or gonorrhea. Ask your health care provider if you are at risk. Ask your health care provider about whether you are at high risk for HIV. Your health care provider  may recommend a prescription medicine to help prevent HIV infection. If you choose to take medicine to prevent HIV, you should first get tested for HIV. You should then be tested every 3 months for as long as you are taking the medicine. Follow these instructions at home: Alcohol use Do not drink alcohol if your health care provider tells you not to drink. If you drink alcohol: Limit how much you have to 0-2 drinks a day. Know how much alcohol is in your drink. In the [place] one drink equals one 12 oz bottle of beer (355 mL), one 5 oz glass of wine (148 mL), or one 1 oz glass of hard liquor (44 mL). Lifestyle Do not use any products that contain nicotine or tobacco. These products include cigarettes, chewing tobacco, and vaping devices, such as e-cigarettes. If you need help quitting, ask your health care provider. Do not use street drugs. Do not share needles. Ask your health care provider for help if you need support or information about quitting drugs. General instructions Schedule regular health, dental, and eye exams. Stay current with your vaccines. Tell your health care provider if: You often feel depressed. You have ever been abused or do not feel safe at home. Summary Adopting a healthy lifestyle and getting preventive care are important in promoting health and wellness. Follow your health care provider's instructions about healthy diet, exercising, and getting tested or screened for diseases. Follow your health care provider's instructions on monitoring your cholesterol and blood pressure. This information is not intended to replace advice given to you by your health care provider. Make sure you discuss any questions you have with your health care provider. Document Revised: 03/01/2021 Document Reviewed: 03/01/2021 Elsevier Patient Education  2023 Elsevier Inc.  

## 2022-04-04 NOTE — Assessment & Plan Note (Signed)
Multifactorial.  Differential diagnosis discussed.  Need for blood work today.

## 2022-04-04 NOTE — Assessment & Plan Note (Signed)
Cardiovascular and cancer risks associated with smoking discussed.  Smoking cessation advice given.  Patient is smoking a lot less these days.

## 2022-04-09 LAB — TESTT+TESTF+SHBG
Sex Hormone Binding: 43.5 nmol/L (ref 16.5–55.9)
Testosterone, Free: 2.6 pg/mL — ABNORMAL LOW (ref 6.8–21.5)
Testosterone, Total, LC/MS: 274.4 ng/dL (ref 264.0–916.0)

## 2022-04-09 LAB — HEMOGLOBIN A1C
Est. average glucose Bld gHb Est-mCnc: 131 mg/dL
Hgb A1c MFr Bld: 6.2 % — ABNORMAL HIGH (ref 4.8–5.6)

## 2022-04-10 ENCOUNTER — Other Ambulatory Visit: Payer: Self-pay | Admitting: Emergency Medicine

## 2022-04-10 DIAGNOSIS — E291 Testicular hypofunction: Secondary | ICD-10-CM

## 2022-05-12 ENCOUNTER — Encounter (INDEPENDENT_AMBULATORY_CARE_PROVIDER_SITE_OTHER): Payer: 59 | Admitting: Ophthalmology

## 2022-07-01 ENCOUNTER — Other Ambulatory Visit: Payer: Self-pay | Admitting: Emergency Medicine

## 2022-07-01 DIAGNOSIS — I10 Essential (primary) hypertension: Secondary | ICD-10-CM

## 2022-10-06 ENCOUNTER — Ambulatory Visit: Payer: 59 | Admitting: Emergency Medicine

## 2022-12-01 ENCOUNTER — Encounter: Payer: Self-pay | Admitting: Emergency Medicine

## 2023-05-17 ENCOUNTER — Encounter: Payer: Self-pay | Admitting: Emergency Medicine

## 2023-05-18 ENCOUNTER — Encounter: Payer: Self-pay | Admitting: Internal Medicine

## 2023-05-18 ENCOUNTER — Ambulatory Visit: Payer: BC Managed Care – PPO | Admitting: Internal Medicine

## 2023-05-18 VITALS — BP 130/92 | HR 65 | Temp 98.3°F | Ht 65.0 in | Wt 221.8 lb

## 2023-05-18 DIAGNOSIS — I10 Essential (primary) hypertension: Secondary | ICD-10-CM | POA: Diagnosis not present

## 2023-05-18 DIAGNOSIS — K5792 Diverticulitis of intestine, part unspecified, without perforation or abscess without bleeding: Secondary | ICD-10-CM | POA: Diagnosis not present

## 2023-05-18 DIAGNOSIS — F17208 Nicotine dependence, unspecified, with other nicotine-induced disorders: Secondary | ICD-10-CM

## 2023-05-18 DIAGNOSIS — R1032 Left lower quadrant pain: Secondary | ICD-10-CM | POA: Insufficient documentation

## 2023-05-18 DIAGNOSIS — R7303 Prediabetes: Secondary | ICD-10-CM

## 2023-05-18 LAB — CBC WITH DIFFERENTIAL/PLATELET
Basophils Absolute: 0 10*3/uL (ref 0.0–0.1)
Basophils Relative: 0.3 % (ref 0.0–3.0)
Eosinophils Absolute: 0.4 10*3/uL (ref 0.0–0.7)
Eosinophils Relative: 3.3 % (ref 0.0–5.0)
HCT: 47.2 % (ref 39.0–52.0)
Hemoglobin: 15.2 g/dL (ref 13.0–17.0)
Lymphocytes Relative: 24.3 % (ref 12.0–46.0)
Lymphs Abs: 2.6 10*3/uL (ref 0.7–4.0)
MCHC: 32.3 g/dL (ref 30.0–36.0)
MCV: 90.6 fl (ref 78.0–100.0)
Monocytes Absolute: 0.8 10*3/uL (ref 0.1–1.0)
Monocytes Relative: 7.5 % (ref 3.0–12.0)
Neutro Abs: 7 10*3/uL (ref 1.4–7.7)
Neutrophils Relative %: 64.6 % (ref 43.0–77.0)
Platelets: 337 10*3/uL (ref 150.0–400.0)
RBC: 5.21 Mil/uL (ref 4.22–5.81)
RDW: 14.5 % (ref 11.5–15.5)
WBC: 10.9 10*3/uL — ABNORMAL HIGH (ref 4.0–10.5)

## 2023-05-18 LAB — URINALYSIS, ROUTINE W REFLEX MICROSCOPIC
Bilirubin Urine: NEGATIVE
Ketones, ur: NEGATIVE
Leukocytes,Ua: NEGATIVE
Nitrite: NEGATIVE
RBC / HPF: NONE SEEN (ref 0–?)
Specific Gravity, Urine: 1.025 (ref 1.000–1.030)
Urine Glucose: NEGATIVE
Urobilinogen, UA: 0.2 (ref 0.0–1.0)
pH: 6 (ref 5.0–8.0)

## 2023-05-18 LAB — BASIC METABOLIC PANEL
BUN: 14 mg/dL (ref 6–23)
CO2: 26 mEq/L (ref 19–32)
Calcium: 9.5 mg/dL (ref 8.4–10.5)
Chloride: 103 mEq/L (ref 96–112)
Creatinine, Ser: 1.24 mg/dL (ref 0.40–1.50)
GFR: 69.07 mL/min (ref >60.00)
Glucose, Bld: 99 mg/dL (ref 70–99)
Potassium: 3.6 mEq/L (ref 3.5–5.1)
Sodium: 136 mEq/L (ref 135–145)

## 2023-05-18 LAB — HEPATIC FUNCTION PANEL
ALT: 24 U/L (ref 0–53)
AST: 23 U/L (ref 0–37)
Albumin: 4.2 g/dL (ref 3.5–5.2)
Alkaline Phosphatase: 103 U/L (ref 39–117)
Bilirubin, Direct: 0.1 mg/dL (ref 0.0–0.3)
Total Bilirubin: 0.7 mg/dL (ref 0.2–1.2)
Total Protein: 7.8 g/dL (ref 6.0–8.3)

## 2023-05-18 LAB — LIPASE: Lipase: 22 U/L (ref 11.0–59.0)

## 2023-05-18 MED ORDER — CEFTRIAXONE SODIUM 1 G IJ SOLR
1.0000 g | Freq: Once | INTRAMUSCULAR | Status: AC
Start: 2023-05-18 — End: 2023-05-18
  Administered 2023-05-18: 1 g via INTRAMUSCULAR

## 2023-05-18 MED ORDER — METRONIDAZOLE 250 MG PO TABS
250.0000 mg | ORAL_TABLET | Freq: Three times a day (TID) | ORAL | 0 refills | Status: DC
Start: 2023-05-18 — End: 2023-12-06

## 2023-05-18 MED ORDER — HYDROCODONE-ACETAMINOPHEN 7.5-325 MG PO TABS
1.0000 | ORAL_TABLET | Freq: Four times a day (QID) | ORAL | 0 refills | Status: DC | PRN
Start: 2023-05-18 — End: 2023-12-06

## 2023-05-18 MED ORDER — CIPROFLOXACIN HCL 500 MG PO TABS: 500.0000 mg | ORAL_TABLET | Freq: Two times a day (BID) | ORAL | 0 refills | Status: AC

## 2023-05-18 NOTE — Assessment & Plan Note (Addendum)
Mod to severe, high suspicion for probable recurrent acute diverticulitis, for hydrocodone prn pain, cipro and flagyl courses x 10 days, labs including UA and cbc, and stat CT abdpelvis - r/o worsening or performation

## 2023-05-18 NOTE — Assessment & Plan Note (Signed)
Lab Results  Component Value Date   HGBA1C 6.2 (H) 04/04/2022   Stable, pt to continue current medical treatment  - diet, wt control

## 2023-05-18 NOTE — Progress Notes (Signed)
Patient ID: Paul [name], male   DOB: 1976-06-26, 47 y.o.   MRN: [id number]        Chief Complaint: follow up LLQ pain x 2-3 days       HPI:  Paul [name] is a 47 y.o. male here with hx of diverticulitis 2021 by CT, now with 2-3 days onset mod to severe LLQ pain, dull pressure like, similar to previous episode with feverish feeing and feeling cold at times, and intermittent mild nausea.  Denies worsening reflux, dysphagia, vomiting, bowel change or blood.  BP has also been mild elevated at home as well, he thinks due to pain.  Good med compliance.  Pt denies chest pain, increased sob or doe, wheezing, orthopnea, PND, increased LE swelling, palpitations, dizziness or syncope.   Pt denies polydipsia, polyuria, or new focal neuro s/s.   Still smoking, not ready to quit       Wt Readings from Last 3 Encounters:  05/18/23 221 lb 12.8 oz (100.6 kg)  04/04/22 230 lb 4 oz (104.4 kg)  06/03/20 246 lb (111.6 kg)   BP Readings from Last 3 Encounters:  05/18/23 (!) 130/92  04/04/22 130/70  06/03/20 140/82         Past Medical History:  Diagnosis Date   Hypertension    Past Surgical History:  Procedure Laterality Date   FRACTURE SURGERY     MANDIBLE SURGERY      reports that he has been smoking cigarettes. He has never used smokeless tobacco. He reports current alcohol use of about 4.0 standard drinks of alcohol per week. He reports that he does not use drugs. family history includes Breast cancer in his mother; Cancer in his father and mother; Lung cancer in his mother. No Known Allergies Current Outpatient Medications on File Prior to Visit  Medication Sig Dispense Refill   amLODipine (NORVASC) 10 MG tablet TAKE 1 TABLET BY MOUTH EVERY DAY 90 tablet 2   polyethylene glycol (MIRALAX) packet Take 17 g by mouth daily. (Patient not taking: Reported on 05/18/2023) 14 each 0   rosuvastatin (CRESTOR) 20 MG tablet Take 1 tablet (20 mg total) by mouth daily. (Patient not taking: Reported on  05/18/2023) 90 tablet 3   Current Facility-Administered Medications on File Prior to Visit  Medication Dose Route Frequency Provider Last Rate Last Admin   0.9 %  sodium chloride infusion  500 mL Intravenous Once Cirigliano, Vito V, DO            ROS:  All others reviewed and negative.  Objective        PE:  BP (!) 130/92 (BP Location: Left Arm, Patient Position: Sitting, Cuff Size: Large)   Pulse 65   Temp 98.3 F (36.8 C) (Oral)   Ht 5\' 5"  (1.651 m)   Wt 221 lb 12.8 oz (100.6 kg)   SpO2 96%   BMI 36.91 kg/m                 Constitutional: Pt appears in NAD               HENT: Head: NCAT.                Right Ear: External ear normal.                 Left Ear: External ear normal.                Eyes: . Pupils are equal, round, and reactive to  light. Conjunctivae and EOM are normal               Nose: without d/c or deformity               Neck: Neck supple. Gross normal ROM               Cardiovascular: Normal rate and regular rhythm.                 Pulmonary/Chest: Effort normal and breath sounds without rales or wheezing.                Abd:  Soft, mod tender LLQ pain without guarding or rebound, ND, + BS, no organomegaly               Neurological: Pt is alert. At baseline orientation, motor grossly intact               Skin: Skin is warm. No rashes, LE edema - none               Psychiatric: Pt behavior is normal without agitation   Micro: none  Cardiac tracings I have personally interpreted today:  none  Pertinent Radiological findings (summarize): none   Lab Results  Component Value Date   WBC 10.9 (H) 05/18/2023   HGB 15.2 05/18/2023   HCT 47.2 05/18/2023   PLT 337.0 05/18/2023   GLUCOSE 99 05/18/2023   CHOL 204 (H) 04/04/2022   TRIG 304.0 (H) 04/04/2022   HDL 44.80 04/04/2022   LDLDIRECT 132.0 04/04/2022   LDLCALC 129 (H) 06/03/2020   ALT 24 05/18/2023   AST 23 05/18/2023   NA 136 05/18/2023   K 3.6 05/18/2023   CL 103 05/18/2023   CREATININE 1.24  05/18/2023   BUN 14 05/18/2023   CO2 26 05/18/2023   TSH 1.370 02/21/2019   HGBA1C 6.2 (H) 04/04/2022   Assessment/Plan:  Paul [name] is a 47 y.o. Black or African American [2] male with  has a past medical history of Hypertension.  LLQ pain Mod to severe, high suspicion for probable recurrent acute diverticulitis, for hydrocodone prn pain, cipro and flagyl courses x 10 days, labs including UA and cbc, and stat CT abdpelvis - r/o worsening or performation  HTN (hypertension) BP Readings from Last 3 Encounters:  05/18/23 (!) 130/92  04/04/22 130/70  06/03/20 140/82   Mild uncontrolled, likely reactive, pt to continue medical treatment norvasc 10 every day, continue to f/up BP at home and next visit   Nicotine addiction Pt counsled to quit, pt not ready  Prediabetes Lab Results  Component Value Date   HGBA1C 6.2 (H) 04/04/2022   Stable, pt to continue current medical treatment  - diet, wt control  Followup: Return if symptoms worsen or fail to improve.  Oliver Barre, MD 05/18/2023 9:15 PM [facility] - [facility]

## 2023-05-18 NOTE — Assessment & Plan Note (Signed)
BP Readings from Last 3 Encounters:  05/18/23 (!) 130/92  04/04/22 130/70  06/03/20 140/82   Mild uncontrolled, likely reactive, pt to continue medical treatment norvasc 10 every day, continue to f/up BP at home and next visit

## 2023-05-18 NOTE — Assessment & Plan Note (Signed)
Pt counsled to quit, pt not ready °

## 2023-05-18 NOTE — Patient Instructions (Signed)
You are given the antibiotic shot today (rocephin)  You are given the work note  Please take all new medication as prescribed - the two pill antibiotics, and pain medication as needed  Please continue all other medications as before, and refills have been done if requested.  Please have the pharmacy call with any other refills you may need.  Please keep your appointments with your specialists as you may have planned  You will be contacted regarding the referral for: CT scan  Please go to the LAB at the blood drawing area for the tests to be done  You will be contacted by phone if any changes need to be made immediately.  Otherwise, you will receive a letter about your results with an explanation, but please check with MyChart first.  Please remember to sign up for MyChart if you have not done so, as this will be important to you in the future with finding out test results, communicating by private email, and scheduling acute appointments online when needed.    Marland Kitchen

## 2023-05-19 ENCOUNTER — Telehealth (HOSPITAL_BASED_OUTPATIENT_CLINIC_OR_DEPARTMENT_OTHER): Payer: Self-pay

## 2023-05-19 NOTE — Addendum Note (Signed)
Addended by: Corwin Levins on: 05/19/2023 08:25 AM   Modules accepted: Orders

## 2023-05-19 NOTE — Addendum Note (Signed)
Addended by: Karma Ganja on: 05/19/2023 08:20 AM   Modules accepted: Orders

## 2023-05-22 ENCOUNTER — Inpatient Hospital Stay (HOSPITAL_BASED_OUTPATIENT_CLINIC_OR_DEPARTMENT_OTHER): Admission: RE | Admit: 2023-05-22 | Payer: BC Managed Care – PPO | Source: Ambulatory Visit

## 2023-07-12 ENCOUNTER — Other Ambulatory Visit: Payer: Self-pay | Admitting: Emergency Medicine

## 2023-07-12 DIAGNOSIS — I10 Essential (primary) hypertension: Secondary | ICD-10-CM

## 2023-08-21 ENCOUNTER — Encounter: Payer: Self-pay | Admitting: Emergency Medicine

## 2023-12-06 ENCOUNTER — Ambulatory Visit: Payer: 59 | Admitting: Emergency Medicine

## 2023-12-06 VITALS — BP 132/88 | HR 76 | Temp 97.6°F | Ht 65.0 in | Wt 223.0 lb

## 2023-12-06 DIAGNOSIS — Z23 Encounter for immunization: Secondary | ICD-10-CM

## 2023-12-06 DIAGNOSIS — R7303 Prediabetes: Secondary | ICD-10-CM

## 2023-12-06 DIAGNOSIS — Z0000 Encounter for general adult medical examination without abnormal findings: Secondary | ICD-10-CM

## 2023-12-06 DIAGNOSIS — Z1329 Encounter for screening for other suspected endocrine disorder: Secondary | ICD-10-CM | POA: Diagnosis not present

## 2023-12-06 DIAGNOSIS — F17209 Nicotine dependence, unspecified, with unspecified nicotine-induced disorders: Secondary | ICD-10-CM

## 2023-12-06 DIAGNOSIS — Z130 Encounter for screening for diseases of the blood and blood-forming organs and certain disorders involving the immune mechanism: Secondary | ICD-10-CM

## 2023-12-06 DIAGNOSIS — Z13228 Encounter for screening for other metabolic disorders: Secondary | ICD-10-CM

## 2023-12-06 DIAGNOSIS — I10 Essential (primary) hypertension: Secondary | ICD-10-CM | POA: Diagnosis not present

## 2023-12-06 DIAGNOSIS — E785 Hyperlipidemia, unspecified: Secondary | ICD-10-CM

## 2023-12-06 DIAGNOSIS — Z0001 Encounter for general adult medical examination with abnormal findings: Secondary | ICD-10-CM

## 2023-12-06 LAB — COMPREHENSIVE METABOLIC PANEL
ALT: 46 U/L (ref 0–53)
AST: 45 U/L — ABNORMAL HIGH (ref 0–37)
Albumin: 4.5 g/dL (ref 3.5–5.2)
Alkaline Phosphatase: 108 U/L (ref 39–117)
BUN: 16 mg/dL (ref 6–23)
CO2: 28 meq/L (ref 19–32)
Calcium: 9.4 mg/dL (ref 8.4–10.5)
Chloride: 103 meq/L (ref 96–112)
Creatinine, Ser: 1.13 mg/dL (ref 0.40–1.50)
GFR: 76.91 mL/min (ref >60.00)
Glucose, Bld: 91 mg/dL (ref 70–99)
Potassium: 4 meq/L (ref 3.5–5.1)
Sodium: 139 meq/L (ref 135–145)
Total Bilirubin: 0.9 mg/dL (ref 0.2–1.2)
Total Protein: 8.3 g/dL (ref 6.0–8.3)

## 2023-12-06 LAB — CBC WITH DIFFERENTIAL/PLATELET
Basophils Absolute: 0 10*3/uL (ref 0.0–0.1)
Basophils Relative: 0.5 % (ref 0.0–3.0)
Eosinophils Absolute: 0.6 10*3/uL (ref 0.0–0.7)
Eosinophils Relative: 7 % — ABNORMAL HIGH (ref 0.0–5.0)
HCT: 44.3 % (ref 39.0–52.0)
Hemoglobin: 14.5 g/dL (ref 13.0–17.0)
Lymphocytes Relative: 27.4 % (ref 12.0–46.0)
Lymphs Abs: 2.3 10*3/uL (ref 0.7–4.0)
MCHC: 32.7 g/dL (ref 30.0–36.0)
MCV: 90.9 fL (ref 78.0–100.0)
Monocytes Absolute: 0.8 10*3/uL (ref 0.1–1.0)
Monocytes Relative: 9.3 % (ref 3.0–12.0)
Neutro Abs: 4.7 10*3/uL (ref 1.4–7.7)
Neutrophils Relative %: 55.8 % (ref 43.0–77.0)
Platelets: 351 10*3/uL (ref 150.0–400.0)
RBC: 4.88 Mil/uL (ref 4.22–5.81)
RDW: 14.8 % (ref 11.5–15.5)
WBC: 8.4 10*3/uL (ref 4.0–10.5)

## 2023-12-06 LAB — LIPID PANEL
Cholesterol: 188 mg/dL (ref 0–200)
HDL: 48.8 mg/dL (ref >39.00)
LDL Cholesterol: 120 mg/dL — ABNORMAL HIGH (ref 0–99)
NonHDL: 139.56
Total CHOL/HDL Ratio: 4
Triglycerides: 100 mg/dL (ref 0.0–149.0)
VLDL: 20 mg/dL (ref 0.0–40.0)

## 2023-12-06 LAB — HEMOGLOBIN A1C: Hgb A1c MFr Bld: 6.3 % (ref 4.6–6.5)

## 2023-12-06 MED ORDER — VARENICLINE TARTRATE 0.5 MG PO TABS: 0.5000 mg | ORAL_TABLET | Freq: Two times a day (BID) | ORAL | 1 refills | Status: AC

## 2023-12-06 NOTE — Patient Instructions (Signed)
Health Maintenance, Male  Adopting a healthy lifestyle and getting preventive care are important in promoting health and wellness. Ask your health care provider about:  The right schedule for you to have regular tests and exams.  Things you can do on your own to prevent diseases and keep yourself healthy.  What should I know about diet, weight, and exercise?  Eat a healthy diet    Eat a diet that includes plenty of vegetables, fruits, low-fat dairy products, and lean protein.  Do not eat a lot of foods that are high in solid fats, added sugars, or sodium.  Maintain a healthy weight  Body mass index (BMI) is a measurement that can be used to identify possible weight problems. It estimates body fat based on height and weight. Your health care provider can help determine your BMI and help you achieve or maintain a healthy weight.  Get regular exercise  Get regular exercise. This is one of the most important things you can do for your health. Most adults should:  Exercise for at least 150 minutes each week. The exercise should increase your heart rate and make you sweat (moderate-intensity exercise).  Do strengthening exercises at least twice a week. This is in addition to the moderate-intensity exercise.  Spend less time sitting. Even light physical activity can be beneficial.  Watch cholesterol and blood lipids  Have your blood tested for lipids and cholesterol at 49 years of age, then have this test every 5 years.  You may need to have your cholesterol levels checked more often if:  Your lipid or cholesterol levels are high.  You are older than 49 years of age.  You are at high risk for heart disease.  What should I know about cancer screening?  Many types of cancers can be detected early and may often be prevented. Depending on your health history and family history, you may need to have cancer screening at various ages. This may include screening for:  Colorectal cancer.  Prostate cancer.  Skin cancer.  Lung  cancer.  What should I know about heart disease, diabetes, and high blood pressure?  Blood pressure and heart disease  High blood pressure causes heart disease and increases the risk of stroke. This is more likely to develop in people who have high blood pressure readings or are overweight.  Talk with your health care provider about your target blood pressure readings.  Have your blood pressure checked:  Every 3-5 years if you are 9-95 years of age.  Every year if you are 85 years old or older.  If you are between the ages of 29 and 29 and are a current or former smoker, ask your health care provider if you should have a one-time screening for abdominal aortic aneurysm (AAA).  Diabetes  Have regular diabetes screenings. This checks your fasting blood sugar level. Have the screening done:  Once every three years after age 23 if you are at a normal weight and have a low risk for diabetes.  More often and at a younger age if you are overweight or have a high risk for diabetes.  What should I know about preventing infection?  Hepatitis B  If you have a higher risk for hepatitis B, you should be screened for this virus. Talk with your health care provider to find out if you are at risk for hepatitis B infection.  Hepatitis C  Blood testing is recommended for:  Everyone born from [birth year] through 1966.  Anyone  with known risk factors for hepatitis C.  Sexually transmitted infections (STIs)  You should be screened each year for STIs, including gonorrhea and chlamydia, if:  You are sexually active and are younger than 49 years of age.  You are older than 49 years of age and your health care provider tells you that you are at risk for this type of infection.  Your sexual activity has changed since you were last screened, and you are at increased risk for chlamydia or gonorrhea. Ask your health care provider if you are at risk.  Ask your health care provider about whether you are at high risk for HIV. Your health care provider  may recommend a prescription medicine to help prevent HIV infection. If you choose to take medicine to prevent HIV, you should first get tested for HIV. You should then be tested every 3 months for as long as you are taking the medicine.  Follow these instructions at home:  Alcohol use  Do not drink alcohol if your health care provider tells you not to drink.  If you drink alcohol:  Limit how much you have to 0-2 drinks a day.  Know how much alcohol is in your drink. In the [place] one drink equals one 12 oz bottle of beer (355 mL), one 5 oz glass of wine (148 mL), or one 1 oz glass of hard liquor (44 mL).  Lifestyle  Do not use any products that contain nicotine or tobacco. These products include cigarettes, chewing tobacco, and vaping devices, such as e-cigarettes. If you need help quitting, ask your health care provider.  Do not use street drugs.  Do not share needles.  Ask your health care provider for help if you need support or information about quitting drugs.  General instructions  Schedule regular health, dental, and eye exams.  Stay current with your vaccines.  Tell your health care provider if:  You often feel depressed.  You have ever been abused or do not feel safe at home.  Summary  Adopting a healthy lifestyle and getting preventive care are important in promoting health and wellness.  Follow your health care provider's instructions about healthy diet, exercising, and getting tested or screened for diseases.  Follow your health care provider's instructions on monitoring your cholesterol and blood pressure.  This information is not intended to replace advice given to you by your health care provider. Make sure you discuss any questions you have with your health care provider.  Document Revised: 03/01/2021 Document Reviewed: 03/01/2021  Elsevier Patient Education  2024 [employer].

## 2023-12-06 NOTE — Assessment & Plan Note (Signed)
Cardiovascular and cancer risks associated with smoking discussed. Smoking cessation advice given. Wants to stop smoking Recommend Chantix.  Prescription sent today

## 2023-12-06 NOTE — Assessment & Plan Note (Addendum)
BP Readings from Last 3 Encounters:  12/06/23 132/88  05/18/23 (!) 130/92  04/04/22 130/70  Mild diastolic elevation Continue amlodipine 10 mg daily Cardiovascular risks associated with hypertension discussed Dietary approaches to stop hypertension discussed Benefits of exercise discussed

## 2023-12-06 NOTE — Assessment & Plan Note (Signed)
Chronic stable condition However not taking rosuvastatin Lipid profile done today Diet and nutrition discussed The 10-year ASCVD risk score (Arnett DK, et al., 2019) is: 14.7%   Values used to calculate the score:     Age: 48 years     Sex: Male     Is Non-Hispanic African American: Yes     Diabetic: No     Tobacco smoker: Yes     Systolic Blood Pressure: 132 mmHg     Is BP treated: Yes     HDL Cholesterol: 44.8 mg/dL     Total Cholesterol: 204 mg/dL

## 2023-12-06 NOTE — Progress Notes (Signed)
Paul [name] 48 y.o.   Chief Complaint  Patient presents with   Annual Exam    Patient states his work schedule changed and is having him standing more and started with Left knee pain but has gotten better now its only his left ankle that is painful, he is using a ankle brace. Patient is having bad upset stomach some days he knows its his diverticulitis     HISTORY OF PRESENT ILLNESS: This is a 48 y.o. male here for annual exam and follow-up of chronic medical conditions History of hypertension, dyslipidemia, prediabetes Still smoking No other complaints or medical concerns today.  HPI   Prior to Admission medications   Medication Sig Start Date End Date Taking? Authorizing Provider  amLODipine (NORVASC) 10 MG tablet TAKE 1 TABLET BY MOUTH EVERY DAY 07/12/23  Yes Edwen Mclester, Eilleen Kempf, MD  HYDROcodone-acetaminophen (NORCO) 7.5-325 MG tablet Take 1 tablet by mouth every 6 (six) hours as needed for moderate pain. Patient not taking: Reported on 12/06/2023 05/18/23   Corwin Levins, MD  metroNIDAZOLE (FLAGYL) 250 MG tablet Take 1 tablet (250 mg total) by mouth 3 (three) times daily. 05/18/23   Corwin Levins, MD  polyethylene glycol [facility]) packet Take 17 g by mouth daily. Patient not taking: Reported on 12/06/2023 09/08/18   Benjiman Core, MD  rosuvastatin (CRESTOR) 20 MG tablet Take 1 tablet (20 mg total) by mouth daily. Patient not taking: Reported on 12/06/2023 04/04/22   Georgina Quint, MD    No Known Allergies  Patient Active Problem List   Diagnosis Date Noted   Morbid (severe) obesity due to excess calories (HCC) 06/03/2020   Prediabetes 06/03/2020   Dyslipidemia 06/03/2020   Tobacco use disorder, continuous 06/03/2020   HTN (hypertension) 05/21/2012   Nicotine addiction 05/21/2012    Past Medical History:  Diagnosis Date   Hypertension     Past Surgical History:  Procedure Laterality Date   FRACTURE SURGERY     MANDIBLE SURGERY      Social History    Socioeconomic History   Marital status: Single    Spouse name: Not on file   Number of children: Not on file   Years of education: Not on file   Highest education level: GED or equivalent  Occupational History   Not on file  Tobacco Use   Smoking status: Light Smoker    Types: Cigarettes   Smokeless tobacco: Never  Vaping Use   Vaping status: Never Used  Substance and Sexual Activity   Alcohol use: Yes    Alcohol/week: 4.0 standard drinks of alcohol    Types: 4 Standard drinks or equivalent per week    Comment: socially   Drug use: No   Sexual activity: Yes  Other Topics Concern   Not on file  Social History Narrative   Not on file   Social Drivers of Health   Financial Resource Strain: Low Risk  (12/06/2023)   Overall Financial Resource Strain (CARDIA)    Difficulty of Paying Living Expenses: Not hard at all  Food Insecurity: No Food Insecurity (12/06/2023)   Hunger Vital Sign    Worried About Running Out of Food in the Last Year: Never true    Ran Out of Food in the Last Year: Never true  Transportation Needs: No Transportation Needs (12/06/2023)   PRAPARE - Administrator, Civil Service (Medical): No    Lack of Transportation (Non-Medical): No  Physical Activity: Insufficiently Active (12/06/2023)   Exercise  Vital Sign    Days of Exercise per Week: 2 days    Minutes of Exercise per Session: 30 min  Stress: No Stress Concern Present (12/06/2023)   [employer] of Occupational Health - Occupational Stress Questionnaire    Feeling of Stress : Not at all  Social Connections: Unknown (12/06/2023)   Social Connection and Isolation Panel [NHANES]    Frequency of Communication with Friends and Family: Once a week    Frequency of Social Gatherings with Friends and Family: Patient declined    Attends Religious Services: 1 to 4 times per year    Active Member of [employer] or Organizations: No    Attends Engineer, structural: Not on file    Marital  Status: Divorced  Intimate Partner Violence: Unknown (01/28/2022)   Received from [employer], [facility]   HITS    Physically Hurt: Not on file    Insult or Talk Down To: Not on file    Threaten Physical Harm: Not on file    Scream or Curse: Not on file    Family History  Problem Relation Age of Onset   Lung cancer Mother    Breast cancer Mother    Cancer Mother    Cancer Father    Colon cancer Neg Hx    Rectal cancer Neg Hx      Review of Systems  Constitutional: Negative.  Negative for chills and fever.  HENT: Negative.  Negative for congestion and sore throat.   Respiratory: Negative.  Negative for cough and shortness of breath.   Cardiovascular: Negative.  Negative for chest pain and palpitations.  Gastrointestinal:  Negative for abdominal pain, nausea and vomiting.  Genitourinary: Negative.  Negative for dysuria and hematuria.  Musculoskeletal:  Positive for joint pain.  Skin: Negative.   Neurological: Negative.  Negative for dizziness and headaches.  All other systems reviewed and are negative.   Vitals:   12/06/23 0913  BP: 132/88  Pulse: 76  Temp: 97.6 F (36.4 C)  SpO2: 98%    Physical Exam Vitals reviewed.  Constitutional:      Appearance: Normal appearance.  HENT:     Head: Normocephalic.     Right Ear: Tympanic membrane, ear canal and external ear normal.     Left Ear: Tympanic membrane, ear canal and external ear normal.     Mouth/Throat:     Mouth: Mucous membranes are moist.     Pharynx: Oropharynx is clear.  Eyes:     Extraocular Movements: Extraocular movements intact.     Conjunctiva/sclera: Conjunctivae normal.     Pupils: Pupils are equal, round, and reactive to light.  Cardiovascular:     Rate and Rhythm: Normal rate and regular rhythm.     Pulses: Normal pulses.     Heart sounds: Normal heart sounds.  Pulmonary:     Effort: Pulmonary effort is normal.     Breath sounds: Normal breath sounds.  Abdominal:     Palpations:  Abdomen is soft.     Tenderness: There is no abdominal tenderness.  Musculoskeletal:     Cervical back: No tenderness.     Right lower leg: No edema.     Left lower leg: No edema.  Lymphadenopathy:     Cervical: No cervical adenopathy.  Skin:    General: Skin is warm and dry.     Capillary Refill: Capillary refill takes less than 2 seconds.  Neurological:     General: No focal deficit present.  Mental Status: He is alert and oriented to person, place, and time.  Psychiatric:        Mood and Affect: Mood normal.        Behavior: Behavior normal.      ASSESSMENT & PLAN: Problem List Items Addressed This Visit       Cardiovascular and Mediastinum   HTN (hypertension)   BP Readings from Last 3 Encounters:  12/06/23 132/88  05/18/23 (!) 130/92  04/04/22 130/70  Mild diastolic elevation Continue amlodipine 10 mg daily Cardiovascular risks associated with hypertension discussed Dietary approaches to stop hypertension discussed Benefits of exercise discussed       Relevant Orders   Comprehensive metabolic panel     Other   Morbid (severe) obesity due to excess calories (HCC)   Diet and nutrition discussed At vies to decrease amount of daily carbohydrate intake and daily calories and increase amount of plant based protein in his diet. Benefits of exercise discussed      Prediabetes   Diet and nutrition discussed Hemoglobin A1c done today Cardiovascular risks associated with prediabetes discussed      Relevant Orders   Hemoglobin A1c   Dyslipidemia   Chronic stable condition However not taking rosuvastatin Lipid profile done today Diet and nutrition discussed The 10-year ASCVD risk score (Arnett DK, et al., 2019) is: 14.7%   Values used to calculate the score:     Age: 89 years     Sex: Male     Is Non-Hispanic African American: Yes     Diabetic: No     Tobacco smoker: Yes     Systolic Blood Pressure: 132 mmHg     Is BP treated: Yes     HDL Cholesterol:  44.8 mg/dL     Total Cholesterol: 204 mg/dL       Relevant Orders   Lipid panel   Tobacco use disorder, continuous   Cardiovascular and cancer risks associated with smoking discussed. Smoking cessation advice given. Wants to stop smoking Recommend Chantix.  Prescription sent today      Relevant Medications   varenicline (CHANTIX) 0.5 MG tablet   Other Visit Diagnoses       Encounter for general adult medical examination with abnormal findings    -  Primary   Relevant Orders   CBC with Differential/Platelet   Comprehensive metabolic panel   Hemoglobin A1c   Lipid panel     Need for vaccination       Relevant Orders   Flu vaccine trivalent PF, 6mos and older(Flulaval,Afluria,Fluarix,Fluzone)     Screening for deficiency anemia       Relevant Orders   CBC with Differential/Platelet     Screening for endocrine, metabolic and immunity disorder       Relevant Orders   Comprehensive metabolic panel      Patient Instructions  Health Maintenance, Male Adopting a healthy lifestyle and getting preventive care are important in promoting health and wellness. Ask your health care provider about: The right schedule for you to have regular tests and exams. Things you can do on your own to prevent diseases and keep yourself healthy. What should I know about diet, weight, and exercise? Eat a healthy diet  Eat a diet that includes plenty of vegetables, fruits, low-fat dairy products, and lean protein. Do not eat a lot of foods that are high in solid fats, added sugars, or sodium. Maintain a healthy weight Body mass index (BMI) is a measurement that can be used to  identify possible weight problems. It estimates body fat based on height and weight. Your health care provider can help determine your BMI and help you achieve or maintain a healthy weight. Get regular exercise Get regular exercise. This is one of the most important things you can do for your health. Most adults  should: Exercise for at least 150 minutes each week. The exercise should increase your heart rate and make you sweat (moderate-intensity exercise). Do strengthening exercises at least twice a week. This is in addition to the moderate-intensity exercise. Spend less time sitting. Even light physical activity can be beneficial. Watch cholesterol and blood lipids Have your blood tested for lipids and cholesterol at 49 years of age, then have this test every 5 years. You may need to have your cholesterol levels checked more often if: Your lipid or cholesterol levels are high. You are older than 49 years of age. You are at high risk for heart disease. What should I know about cancer screening? Many types of cancers can be detected early and may often be prevented. Depending on your health history and family history, you may need to have cancer screening at various ages. This may include screening for: Colorectal cancer. Prostate cancer. Skin cancer. Lung cancer. What should I know about heart disease, diabetes, and high blood pressure? Blood pressure and heart disease High blood pressure causes heart disease and increases the risk of stroke. This is more likely to develop in people who have high blood pressure readings or are overweight. Talk with your health care provider about your target blood pressure readings. Have your blood pressure checked: Every 3-5 years if you are 18-24 years of age. Every year if you are 55 years old or older. If you are between the ages of 10 and 50 and are a current or former smoker, ask your health care provider if you should have a one-time screening for abdominal aortic aneurysm (AAA). Diabetes Have regular diabetes screenings. This checks your fasting blood sugar level. Have the screening done: Once every three years after age 81 if you are at a normal weight and have a low risk for diabetes. More often and at a younger age if you are overweight or have a high  risk for diabetes. What should I know about preventing infection? Hepatitis B If you have a higher risk for hepatitis B, you should be screened for this virus. Talk with your health care provider to find out if you are at risk for hepatitis B infection. Hepatitis C Blood testing is recommended for: Everyone born from [birth year] through 1966. Anyone with known risk factors for hepatitis C. Sexually transmitted infections (STIs) You should be screened each year for STIs, including gonorrhea and chlamydia, if: You are sexually active and are younger than 49 years of age. You are older than 49 years of age and your health care provider tells you that you are at risk for this type of infection. Your sexual activity has changed since you were last screened, and you are at increased risk for chlamydia or gonorrhea. Ask your health care provider if you are at risk. Ask your health care provider about whether you are at high risk for HIV. Your health care provider may recommend a prescription medicine to help prevent HIV infection. If you choose to take medicine to prevent HIV, you should first get tested for HIV. You should then be tested every 3 months for as long as you are taking the medicine. Follow these instructions at  home: Alcohol use Do not drink alcohol if your health care provider tells you not to drink. If you drink alcohol: Limit how much you have to 0-2 drinks a day. Know how much alcohol is in your drink. In the [place] one drink equals one 12 oz bottle of beer (355 mL), one 5 oz glass of wine (148 mL), or one 1 oz glass of hard liquor (44 mL). Lifestyle Do not use any products that contain nicotine or tobacco. These products include cigarettes, chewing tobacco, and vaping devices, such as e-cigarettes. If you need help quitting, ask your health care provider. Do not use street drugs. Do not share needles. Ask your health care provider for help if you need support or information about  quitting drugs. General instructions Schedule regular health, dental, and eye exams. Stay current with your vaccines. Tell your health care provider if: You often feel depressed. You have ever been abused or do not feel safe at home. Summary Adopting a healthy lifestyle and getting preventive care are important in promoting health and wellness. Follow your health care provider's instructions about healthy diet, exercising, and getting tested or screened for diseases. Follow your health care provider's instructions on monitoring your cholesterol and blood pressure. This information is not intended to replace advice given to you by your health care provider. Make sure you discuss any questions you have with your health care provider. Document Revised: 03/01/2021 Document Reviewed: 03/01/2021 Elsevier Patient Education  2024 Elsevier Inc.      Edwina Barth, MD [facility]

## 2023-12-06 NOTE — Assessment & Plan Note (Signed)
Diet and nutrition discussed At vies to decrease amount of daily carbohydrate intake and daily calories and increase amount of plant based protein in his diet. Benefits of exercise discussed

## 2023-12-06 NOTE — Assessment & Plan Note (Signed)
Diet and nutrition discussed.  Hemoglobin A1c done today. Cardiovascular risks associated with prediabetes discussed.

## 2023-12-07 ENCOUNTER — Encounter: Payer: Self-pay | Admitting: Emergency Medicine

## 2023-12-11 ENCOUNTER — Other Ambulatory Visit (HOSPITAL_COMMUNITY): Payer: Self-pay

## 2023-12-11 ENCOUNTER — Telehealth: Payer: Self-pay

## 2023-12-11 NOTE — Telephone Encounter (Signed)
Pharmacy Patient Advocate Encounter   Received notification from  [employer]  that prior authorization for Varenicline Tartrate 0.5MG  tablets is required/requested.   Insurance verification completed.   The patient is insured through [facility] .   Per test claim: PA required; PA submitted to above mentioned insurance via CoverMyMeds Key/confirmation #/EOC [id number] Status is pending

## 2023-12-12 ENCOUNTER — Ambulatory Visit: Payer: 59 | Admitting: Emergency Medicine

## 2023-12-20 ENCOUNTER — Ambulatory Visit: Payer: 59 | Admitting: Emergency Medicine

## 2023-12-20 ENCOUNTER — Encounter: Payer: Self-pay | Admitting: Radiology

## 2023-12-20 NOTE — Telephone Encounter (Signed)
 Pharmacy Patient Advocate Encounter  Received notification from [facility] that Prior Authorization for Varenicline Tartrate 0.5MG  tablets has been DENIED.  Full denial letter will be uploaded to the media tab. See denial reason below.   PA #/Case ID/Reference #: [id number]

## 2023-12-21 ENCOUNTER — Encounter: Payer: Self-pay | Admitting: Family Medicine

## 2023-12-21 ENCOUNTER — Ambulatory Visit: Payer: Self-pay | Admitting: Emergency Medicine

## 2023-12-21 ENCOUNTER — Ambulatory Visit: Payer: 59 | Admitting: Family Medicine

## 2023-12-21 VITALS — BP 112/80 | HR 60 | Temp 97.6°F | Ht 65.0 in | Wt 231.0 lb

## 2023-12-21 DIAGNOSIS — F17200 Nicotine dependence, unspecified, uncomplicated: Secondary | ICD-10-CM

## 2023-12-21 DIAGNOSIS — R361 Hematospermia: Secondary | ICD-10-CM | POA: Diagnosis not present

## 2023-12-21 DIAGNOSIS — R319 Hematuria, unspecified: Secondary | ICD-10-CM | POA: Diagnosis not present

## 2023-12-21 LAB — POC URINALSYSI DIPSTICK (AUTOMATED)
Bilirubin, UA: NEGATIVE
Blood, UA: POSITIVE
Glucose, UA: NEGATIVE
Ketones, UA: NEGATIVE
Leukocytes, UA: NEGATIVE
Nitrite, UA: NEGATIVE
Protein, UA: NEGATIVE
Spec Grav, UA: 1.015 (ref 1.010–1.025)
Urobilinogen, UA: 0.2 U/dL
pH, UA: 6.5 (ref 5.0–8.0)

## 2023-12-21 LAB — CBC WITH DIFFERENTIAL/PLATELET
Basophils Absolute: 0 10*3/uL (ref 0.0–0.1)
Basophils Relative: 0.4 % (ref 0.0–3.0)
Eosinophils Absolute: 0.4 10*3/uL (ref 0.0–0.7)
Eosinophils Relative: 4.5 % (ref 0.0–5.0)
HCT: 42.8 % (ref 39.0–52.0)
Hemoglobin: 14.2 g/dL (ref 13.0–17.0)
Lymphocytes Relative: 23.4 % (ref 12.0–46.0)
Lymphs Abs: 2 10*3/uL (ref 0.7–4.0)
MCHC: 33.2 g/dL (ref 30.0–36.0)
MCV: 90.9 fL (ref 78.0–100.0)
Monocytes Absolute: 0.7 10*3/uL (ref 0.1–1.0)
Monocytes Relative: 8.4 % (ref 3.0–12.0)
Neutro Abs: 5.4 10*3/uL (ref 1.4–7.7)
Neutrophils Relative %: 63.3 % (ref 43.0–77.0)
Platelets: 294 10*3/uL (ref 150.0–400.0)
RBC: 4.71 Mil/uL (ref 4.22–5.81)
RDW: 15 % (ref 11.5–15.5)
WBC: 8.5 10*3/uL (ref 4.0–10.5)

## 2023-12-21 LAB — URINALYSIS, ROUTINE W REFLEX MICROSCOPIC
Bilirubin Urine: NEGATIVE
Hgb urine dipstick: NEGATIVE
Ketones, ur: NEGATIVE
Leukocytes,Ua: NEGATIVE
Nitrite: NEGATIVE
RBC / HPF: NONE SEEN (ref 0–?)
Specific Gravity, Urine: 1.01 (ref 1.000–1.030)
Total Protein, Urine: NEGATIVE
Urine Glucose: NEGATIVE
Urobilinogen, UA: 0.2 (ref 0.0–1.0)
pH: 6.5 (ref 5.0–8.0)

## 2023-12-21 LAB — PSA: PSA: 1.92 ng/mL (ref 0.10–4.00)

## 2023-12-21 NOTE — Patient Instructions (Signed)
 Please go downstairs for labs and a urine test.   We will be in touch with your results and with recommendations.

## 2023-12-21 NOTE — Progress Notes (Signed)
 Subjective:     Patient ID: Paul [name], male    DOB: 08-Mar-1976, 48 y.o.   MRN: [id number]  No chief complaint on file.   HPI   History of Present Illness         Here with c/o hematuria and blood in his semen for 2-3 weeks. Denies dysuria, changes in stream, urinary urgency, frequency, flank pain, abdominal pain, N/V/D or constipation.   Denies concern for STD   Nocturia 2-3 times   Smokes.    Health Maintenance Due  Topic Date Due   Pneumococcal Vaccine 42-81 Years old (1 of 2 - PCV) Never done   INFLUENZA VACCINE  Never done   COVID-19 Vaccine (1 - 2024-25 season) Never done    Past Medical History:  Diagnosis Date   Hypertension     Past Surgical History:  Procedure Laterality Date   FRACTURE SURGERY     MANDIBLE SURGERY      Family History  Problem Relation Age of Onset   Lung cancer Mother    Breast cancer Mother    Cancer Mother    Cancer Father    Colon cancer Neg Hx    Rectal cancer Neg Hx     Social History   Socioeconomic History   Marital status: Single    Spouse name: Not on file   Number of children: Not on file   Years of education: Not on file   Highest education level: GED or equivalent  Occupational History   Not on file  Tobacco Use   Smoking status: Light Smoker    Types: Cigarettes   Smokeless tobacco: Never  Vaping Use   Vaping status: Never Used  Substance and Sexual Activity   Alcohol use: Yes    Alcohol/week: 4.0 standard drinks of alcohol    Types: 4 Standard drinks or equivalent per week    Comment: socially   Drug use: No   Sexual activity: Yes  Other Topics Concern   Not on file  Social History Narrative   Not on file   Social Drivers of Health   Financial Resource Strain: Low Risk  (12/06/2023)   Overall Financial Resource Strain (CARDIA)    Difficulty of Paying Living Expenses: Not hard at all  Food Insecurity: No Food Insecurity (12/06/2023)   Hunger Vital Sign    Worried About Running Out of  Food in the Last Year: Never true    Ran Out of Food in the Last Year: Never true  Transportation Needs: No Transportation Needs (12/06/2023)   PRAPARE - Administrator, Civil Service (Medical): No    Lack of Transportation (Non-Medical): No  Physical Activity: Insufficiently Active (12/06/2023)   Exercise Vital Sign    Days of Exercise per Week: 2 days    Minutes of Exercise per Session: 30 min  Stress: No Stress Concern Present (12/06/2023)   [employer] of Occupational Health - Occupational Stress Questionnaire    Feeling of Stress : Not at all  Social Connections: Unknown (12/06/2023)   Social Connection and Isolation Panel [NHANES]    Frequency of Communication with Friends and Family: Once a week    Frequency of Social Gatherings with Friends and Family: Patient declined    Attends Religious Services: 1 to 4 times per year    Active Member of [employer] or Organizations: No    Attends Engineer, structural: Not on file    Marital Status: Divorced  Intimate Partner Violence: Unknown (  01/28/2022)   Received from [facility], [facility]   HITS    Physically Hurt: Not on file    Insult or Talk Down To: Not on file    Threaten Physical Harm: Not on file    Scream or Curse: Not on file    Outpatient Medications Prior to Visit  Medication Sig Dispense Refill   amLODipine (NORVASC) 10 MG tablet TAKE 1 TABLET BY MOUTH EVERY DAY 90 tablet 2   rosuvastatin (CRESTOR) 20 MG tablet Take 1 tablet (20 mg total) by mouth daily. (Patient not taking: Reported on 05/18/2023) 90 tablet 3   varenicline (CHANTIX) 0.5 MG tablet Take 1 tablet (0.5 mg total) by mouth 2 (two) times daily. (Patient not taking: Reported on 12/21/2023) 60 tablet 1   Facility-Administered Medications Prior to Visit  Medication Dose Route Frequency Provider Last Rate Last Admin   0.9 %  sodium chloride infusion  500 mL Intravenous Once Cirigliano, Vito V, DO        No Known Allergies  Review of  Systems  Constitutional:  Negative for chills, diaphoresis, fever, malaise/fatigue and weight loss.  Respiratory:  Negative for shortness of breath.   Cardiovascular:  Negative for chest pain, palpitations and leg swelling.  Gastrointestinal:  Negative for abdominal pain, blood in stool, constipation, diarrhea, nausea and vomiting.  Genitourinary:  Positive for hematuria. Negative for dysuria, flank pain, frequency and urgency.  Musculoskeletal:  Negative for back pain, joint pain and myalgias.  Neurological:  Negative for dizziness, focal weakness and headaches.       Objective:    Physical Exam Constitutional:      General: He is not in acute distress.    Appearance: He is not ill-appearing.  HENT:     Mouth/Throat:     Mouth: Mucous membranes are moist.  Eyes:     Extraocular Movements: Extraocular movements intact.     Conjunctiva/sclera: Conjunctivae normal.  Cardiovascular:     Rate and Rhythm: Normal rate and regular rhythm.  Pulmonary:     Effort: Pulmonary effort is normal.     Breath sounds: Normal breath sounds.  Abdominal:     General: There is no distension.     Palpations: Abdomen is soft.     Tenderness: There is no abdominal tenderness. There is no right CVA tenderness, left CVA tenderness, guarding or rebound.  Musculoskeletal:     Cervical back: Normal range of motion and neck supple. No tenderness.  Lymphadenopathy:     Cervical: No cervical adenopathy.  Skin:    General: Skin is warm and dry.     Coloration: Skin is not pale.  Neurological:     General: No focal deficit present.     Mental Status: He is alert and oriented to person, place, and time.     Motor: No weakness.     Coordination: Coordination normal.     Gait: Gait normal.  Psychiatric:        Mood and Affect: Mood normal.        Behavior: Behavior normal.        Thought Content: Thought content normal.      BP 112/80 (BP Location: Left Arm, Patient Position: Sitting)   Pulse 60    Temp 97.6 F (36.4 C) (Temporal)   Ht 5\' 5"  (1.651 m)   Wt 231 lb (104.8 kg)   SpO2 97%   BMI 38.44 kg/m  Wt Readings from Last 3 Encounters:  12/21/23 231 lb (104.8 kg)  12/06/23 223 lb (101.2 kg)  05/18/23 221 lb 12.8 oz (100.6 kg)       Assessment & Plan:   Problem List Items Addressed This Visit   None Visit Diagnoses       Painless hematuria    -  Primary   Relevant Orders   PSA (Completed)   CBC with Differential/Platelet (Completed)   GC/Chlamydia Probe Amp (Completed)   Urinalysis, Routine w reflex microscopic (Completed)   Urine Culture (Completed)     Hematuria, unspecified type       Relevant Orders   POCT Urinalysis Dipstick (Automated) (Completed)     Smoker         Blood in semen       Relevant Orders   PSA (Completed)      UA dipstick shows trace blood, negative otherwise.  Urinalysis with reflex to microscopic ordered. Urine culture ordered.  PSA, GC/CT and CBC ordered.  We discussed painless hematuria should be evaluated by urologist. Discussed smoking increases risk for bladder cancer.  Blood in semen is a non issue.  Follow up pending results. Referral to urology or renal CT if urine shows RBCs>10  I am having Lev L. Rabel "Donnie" maintain his rosuvastatin, amLODipine, and varenicline. We will continue to administer sodium chloride.  No orders of the defined types were placed in this encounter.

## 2023-12-21 NOTE — Telephone Encounter (Signed)
  Chief Complaint: intermittent blood in urine and semen Symptoms: nocturia, blood in semen, blood (dark red) in urine Frequency: intermittently over the past few weeks Pertinent Negatives: Patient denies blood clots, back or flank pain, nausea, vomiting, urine dribbling or only drops, painful or burning urination. Disposition: [] ED /[] Urgent Care (no appt availability in office) / [x] Appointment(In office/virtual)/ []  [facility]/ [] Home Care/ [] Refused Recommended Disposition /[] [facility]/ []  Follow-up with PCP Additional Notes: Patient states he notices the problem occurs more frequently if he drinks soda or if he holds his bladder (due to being at work). He denies taking any OTC medications or treatments. Patient agreeable to acute visit today with available provider.  Copied from CRM [id number]. Topic: Clinical - Red Word Triage >> Dec 21, 2023  7:52 AM Kathryne Eriksson wrote: Red Word that prompted transfer to Nurse Triage: Blood In Urine Reason for Disposition  Blood in urine  (Exception: Could be normal menstrual bleeding.)  Answer Assessment - Initial Assessment Questions 1. COLOR of URINE: "Describe the color of the urine."  (e.g., tea-colored, pink, red, bloody) "Do you have blood clots in your urine?" (e.g., none, pea, grape, small coin)     Sometimes it is clear, and doesn't see any traces of blood. Patient states sometimes if he drinks soda or if he has to hold his bladder it will be darker and then blood in it.  (Dark blood). Denies any blood clots.  2. ONSET: "When did the bleeding start?"      A few weeks ago.  3. EPISODES: "How many times has there been blood in the urine?" or "How many times today?"     Once a week or a couple times a week. He states he might see it twice in a day but then doesn't see it anymore.  4. PAIN with URINATION: "Is there any pain with passing your urine?" If Yes, ask: "How bad is the pain?"  (Scale 1-10; or mild, moderate,  severe)    - MILD: Complains slightly about urination hurting.    - MODERATE: Interferes with normal activities.      - SEVERE: Excruciating, unwilling or unable to urinate because of the pain.      Denies.  5. FEVER: "Do you have a fever?" If Yes, ask: "What is your temperature, how was it measured, and when did it start?"     Denies.  6. ASSOCIATED SYMPTOMS: "Are you passing urine more frequently than usual?"     "It's hard to tell cause I drink a lot of water". States at night he thinks he might be urinating more frequently.  7. OTHER SYMPTOMS: "Do you have any other symptoms?" (e.g., back/flank pain, abdomen pain, vomiting)     Blood in semen, abdominal pressure (lower, "like if I've been holding my bladder too long")  Protocols used: Urine - Blood In-A-AH

## 2023-12-22 ENCOUNTER — Encounter: Payer: Self-pay | Admitting: Family Medicine

## 2023-12-22 LAB — URINE CULTURE: Result:: NO GROWTH

## 2023-12-22 NOTE — Progress Notes (Signed)
 Please call him and make sure he sees his results.  Please see my note to him.  He should follow-up with Dr. Alvy Bimler if he sees any more blood in his urine or if he has any new symptoms.

## 2023-12-24 LAB — GC/CHLAMYDIA PROBE AMP
Chlamydia trachomatis, NAA: NEGATIVE
Neisseria Gonorrhoeae by PCR: NEGATIVE

## 2023-12-28 ENCOUNTER — Ambulatory Visit: Payer: 59 | Admitting: Internal Medicine

## 2024-08-07 ENCOUNTER — Other Ambulatory Visit: Payer: Self-pay | Admitting: Emergency Medicine

## 2024-08-07 DIAGNOSIS — I10 Essential (primary) hypertension: Secondary | ICD-10-CM
